# Patient Record
Sex: Male | Born: 1965 | Race: White | Hispanic: No | Marital: Single | Smoking: Never smoker
Health system: Southern US, Community
[De-identification: ages and names within clinical notes are randomized; demographics above are authoritative.]

## PROBLEM LIST (undated history)

## (undated) DIAGNOSIS — M549 Dorsalgia, unspecified: Secondary | ICD-10-CM

## (undated) DIAGNOSIS — K219 Gastro-esophageal reflux disease without esophagitis: Secondary | ICD-10-CM

## (undated) DIAGNOSIS — M722 Plantar fascial fibromatosis: Secondary | ICD-10-CM

## (undated) DIAGNOSIS — H11009 Unspecified pterygium of unspecified eye: Secondary | ICD-10-CM

## (undated) DIAGNOSIS — R3911 Hesitancy of micturition: Secondary | ICD-10-CM

## (undated) HISTORY — DX: Gastro-esophageal reflux disease without esophagitis: K21.9

## (undated) HISTORY — DX: Unspecified pterygium of unspecified eye: H11.009

## (undated) HISTORY — DX: Dorsalgia, unspecified: M54.9

## (undated) HISTORY — DX: Hesitancy of micturition: R39.11

## (undated) HISTORY — DX: Plantar fascial fibromatosis: M72.2

---

## 2012-04-18 ENCOUNTER — Emergency Department: Payer: Self-pay | Admitting: Emergency Medicine

## 2012-04-18 LAB — URINALYSIS, COMPLETE
Bacteria: NONE SEEN
Bilirubin,UR: NEGATIVE
Blood: NEGATIVE
Glucose,UR: NEGATIVE mg/dL (ref 0–75)
Specific Gravity: 1.014 (ref 1.003–1.030)
Squamous Epithelial: NONE SEEN
WBC UR: <1 /HPF (ref 0–5)

## 2014-12-28 ENCOUNTER — Encounter: Payer: Self-pay | Admitting: *Deleted

## 2014-12-28 ENCOUNTER — Emergency Department
Admission: EM | Admit: 2014-12-28 | Discharge: 2014-12-28 | Disposition: A | Discharge status: Home / Self Care | Payer: BLUE CROSS/BLUE SHIELD | Attending: Emergency Medicine | Admitting: Emergency Medicine

## 2014-12-28 ENCOUNTER — Emergency Department: Payer: BLUE CROSS/BLUE SHIELD

## 2014-12-28 DIAGNOSIS — R2241 Localized swelling, mass and lump, right lower limb: Secondary | ICD-10-CM | POA: Diagnosis present

## 2014-12-28 DIAGNOSIS — R609 Edema, unspecified: Secondary | ICD-10-CM | POA: Diagnosis not present

## 2014-12-28 NOTE — ED Notes (Signed)
Patient present to ED with c/o right leg swelling and redness since 4 pm today. Reports he hit his knee on the tailgate of his truck a week ago, reports had his knee wrapped with ace bandage. Redness and swelling noted to lower leg. +pedal pulses and capillary refill WNL. Patient alert and oriented x 4, respirations even and unlabored.

## 2014-12-28 NOTE — ED Provider Notes (Signed)
[facility] Emergency Department Provider Note     Time seen: ----------------------------------------- 8:46 PM on 12/28/2014 -----------------------------------------    I have reviewed the triage vital signs and the nursing notes.   HISTORY  Chief Complaint Leg Swelling    HPI Julian Flores is a 49 y.o. male who presents ER with atraumatic right leg swelling and redness since 4 PM today. Patient states he recently had his right knee but he did not have any swelling. He had the knee wrapped in Ace bandage, does not complain of any pain just noted the swelling today.   No past medical history on file.  There are no active problems to display for this patient.   No past surgical history on file.  Allergies Review of patient's allergies indicates no known allergies.  Social History Social History  Substance Use Topics  . Smoking status: Never Smoker   . Smokeless tobacco: Not on file  . Alcohol Use: Yes    Review of Systems Constitutional: Negative for fever. Eyes: Negative for visual changes. ENT: Negative for sore throat. Cardiovascular: Negative for chest pain. Respiratory: Negative for shortness of breath. Gastrointestinal: Negative for abdominal pain, vomiting and diarrhea. Genitourinary: Negative for dysuria. Musculoskeletal: Positive for right leg swelling Skin: Positive for redness in the right lower extremity Neurological: Negative for headaches, focal weakness or numbness.  10-point ROS otherwise negative.  ____________________________________________   PHYSICAL EXAM:  VITAL SIGNS: ED Triage Vitals  Enc Vitals Group     BP 12/28/14 1933 120/87 mmHg     Pulse Rate 12/28/14 1933 66     Resp 12/28/14 1933 20     Temp 12/28/14 1933 98.4 F (36.9 C)     Temp Source 12/28/14 1933 Oral     SpO2 12/28/14 1933 96 %     Weight 12/28/14 1933 165 lb (74.844 kg)     Height 12/28/14 1933  (1.702 m)     Head Cir --    Peak Flow --      Pain Score 12/28/14 2039 0     Pain Loc --      Pain Edu? --      Excl. in GC? --     Constitutional: Alert and oriented. Well appearing and in no distress. Musculoskeletal: Obvious right lower extremity swelling below the knee Neurologic:  Normal speech and language. No gross focal neurologic deficits are appreciated. Speech is normal. No gait instability. Skin:  Mild skin erythema and near circumferential pattern below the right knee involving the right calf Psychiatric: Mood and affect are normal.   ____________________________________________  ED COURSE:  Pertinent labs & imaging results that were available during my care of the patient were reviewed by me and considered in my medical decision making (see chart for details). Nonspecific unilateral swelling, will obtain an ultrasound ____________________________________________   RADIOLOGY Images were viewed by me  IMPRESSION: No evidence of deep venous thrombosis.  ____________________________________________  FINAL ASSESSMENT AND PLAN  Edema  Plan: Patient with labs and imaging as dictated above. No clear cause for his symptoms other than recent compression around the knee. He is stable for outpatient follow-up with his doctor. This does not appear to be cellulitis at this time.   Emily Filbert, MD   Emily Filbert, MD 12/28/14 [phone]

## 2014-12-28 NOTE — ED Notes (Signed)
Pt walked over from [facility].  Pt has swelling to right lower leg.  Pt has injured right knee twice during the past month.  Noticed swelling this afternoon. No pain.

## 2014-12-28 NOTE — Discharge Instructions (Signed)

## 2015-09-14 DIAGNOSIS — R0789 Other chest pain: Secondary | ICD-10-CM | POA: Insufficient documentation

## 2016-12-01 IMAGING — US US EXTREM LOW VENOUS*R*
1 series · 13 of 24 positions shown · non-contrast
Comparison: None.

CLINICAL DATA: Right leg swelling since 4 p.m.



[Series 1: us extrem low venous*right* · 0.06mm/px · 13 of 37 slices shown]
[im 1/37]
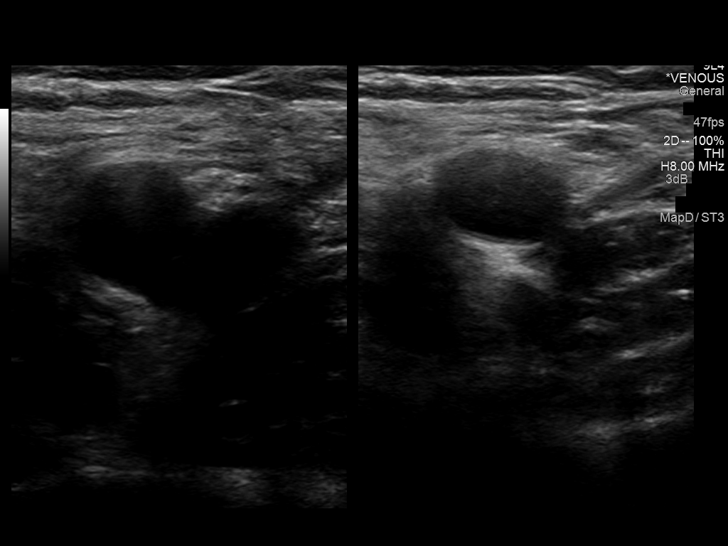
[im 4/37]
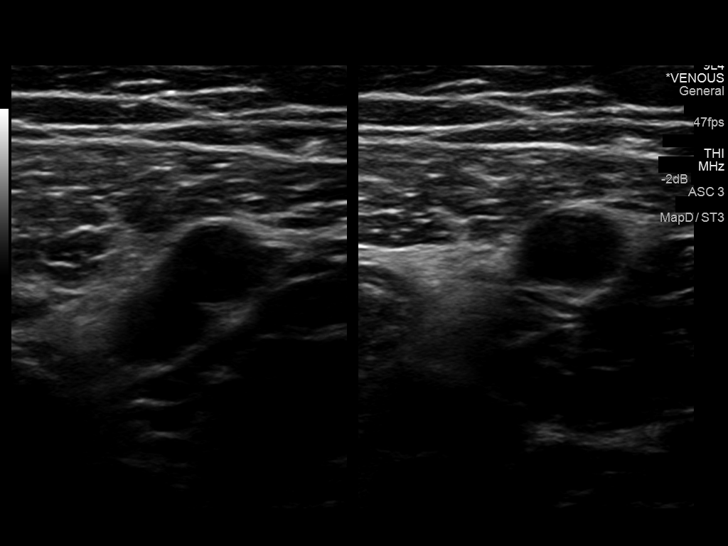
[im 7/37]
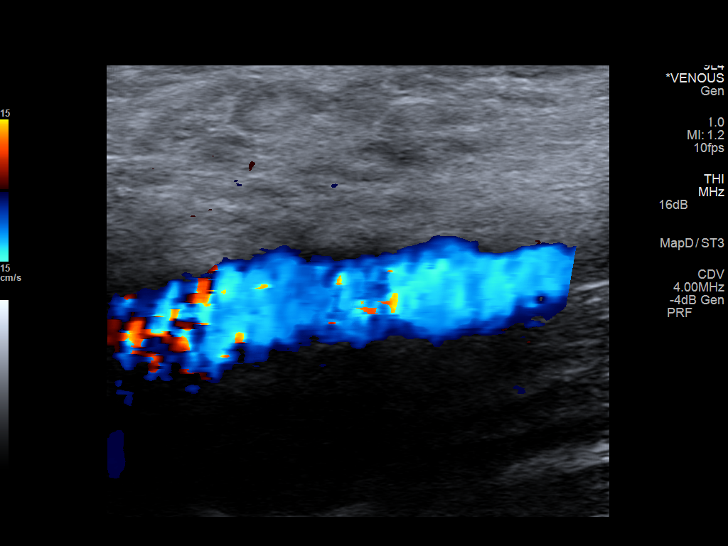
[im 10/37]
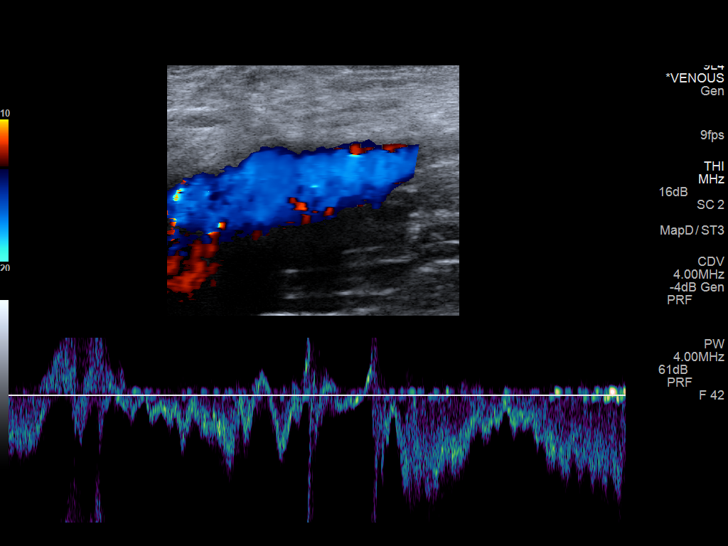
[im 13/37]
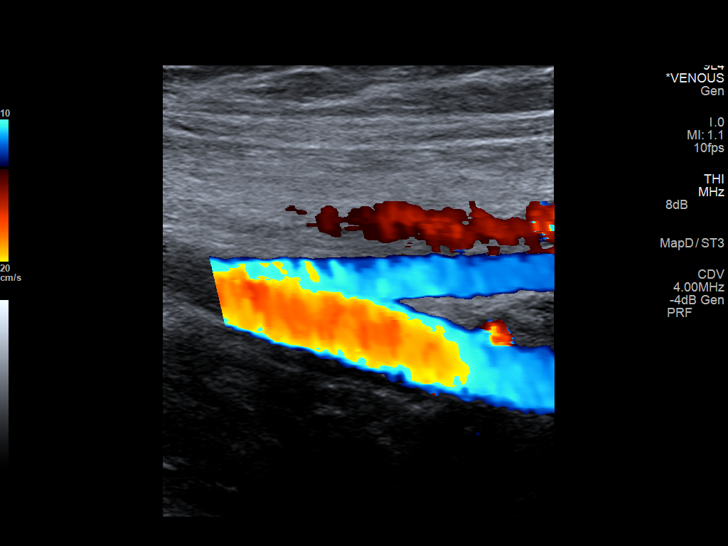
[im 16/37]
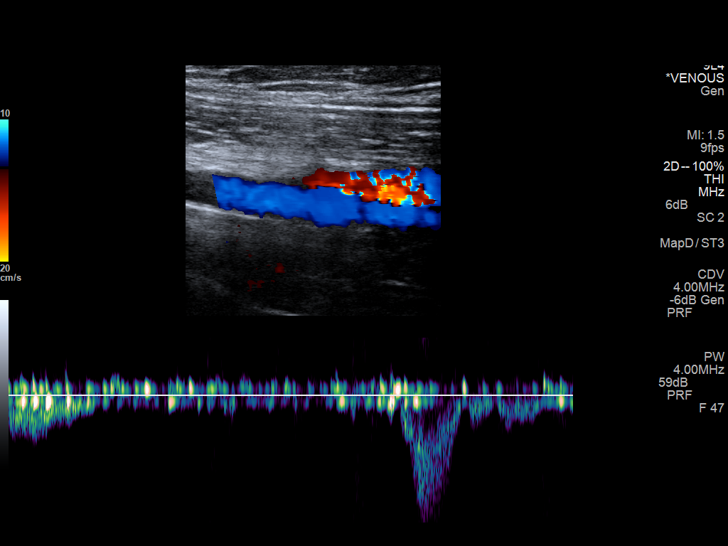
[im 19/37]
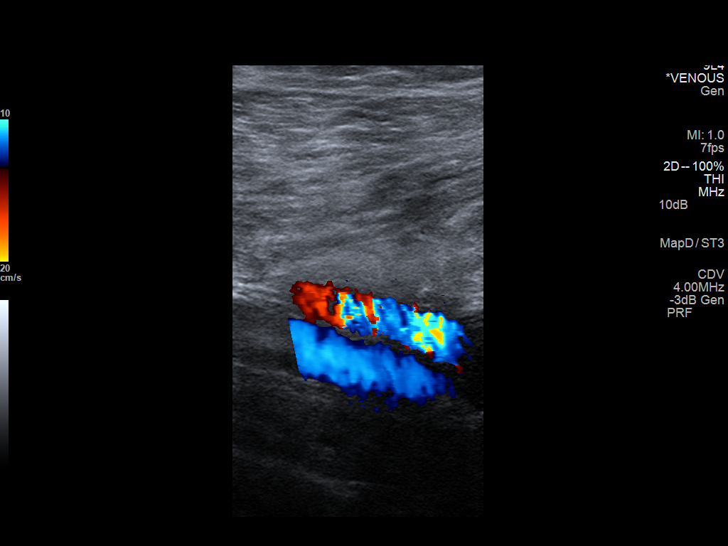
[im 21/37]
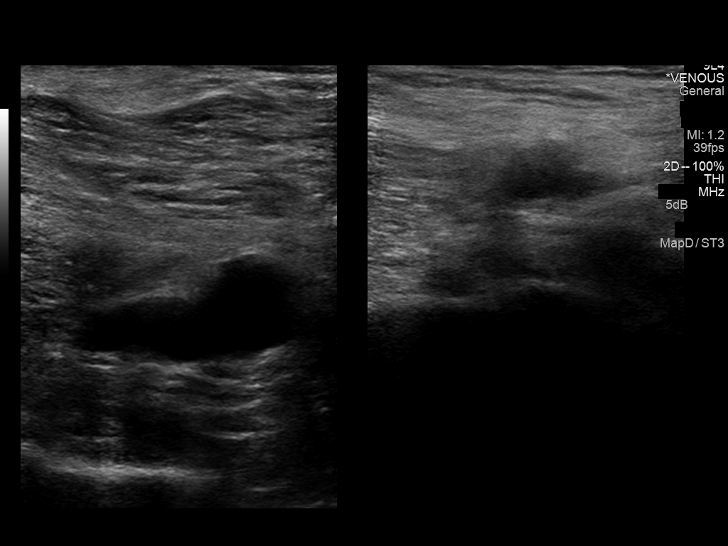
[im 24/37]
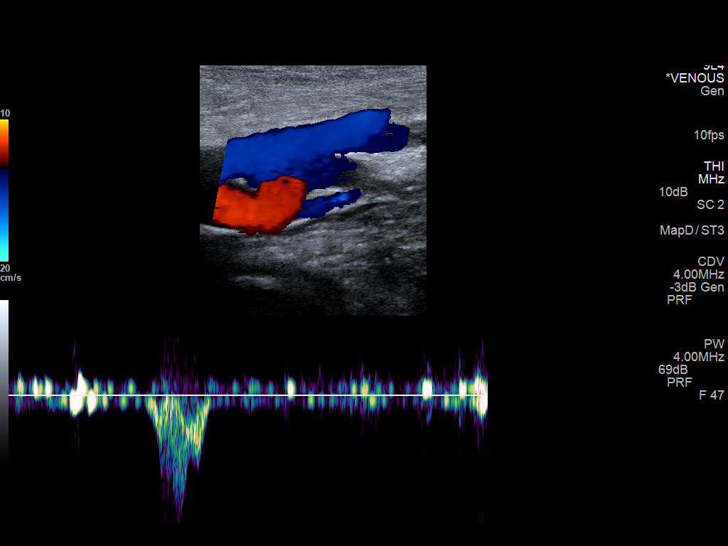
[im 27/37]
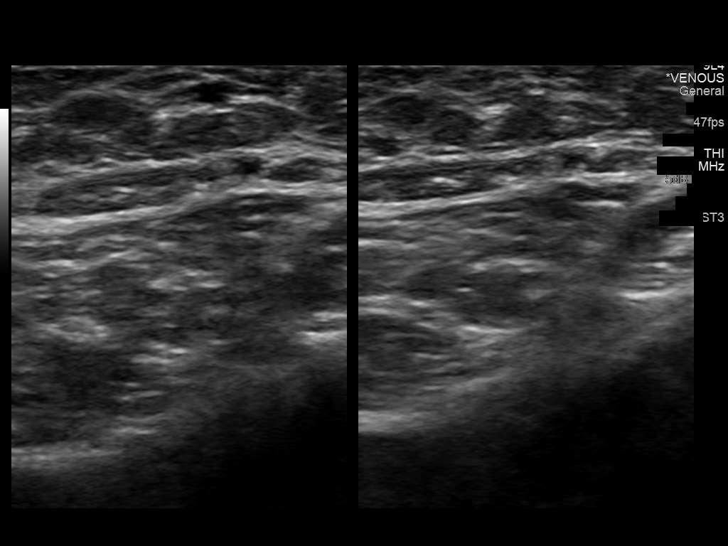
[im 30/37]
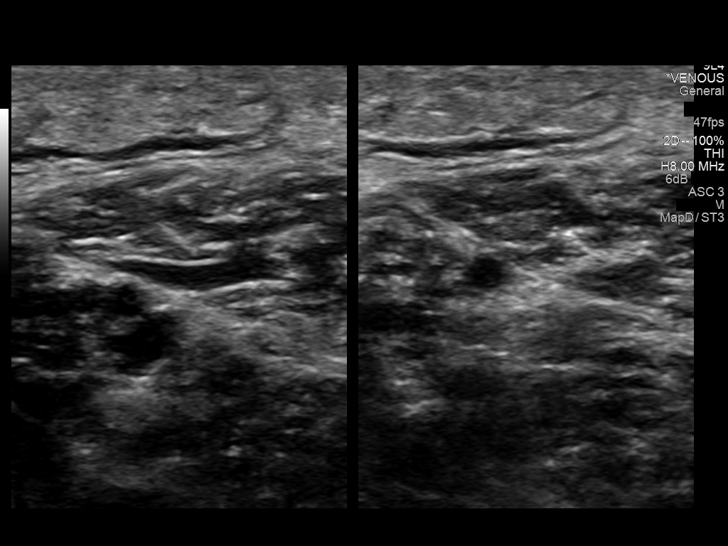
[im 33/37]
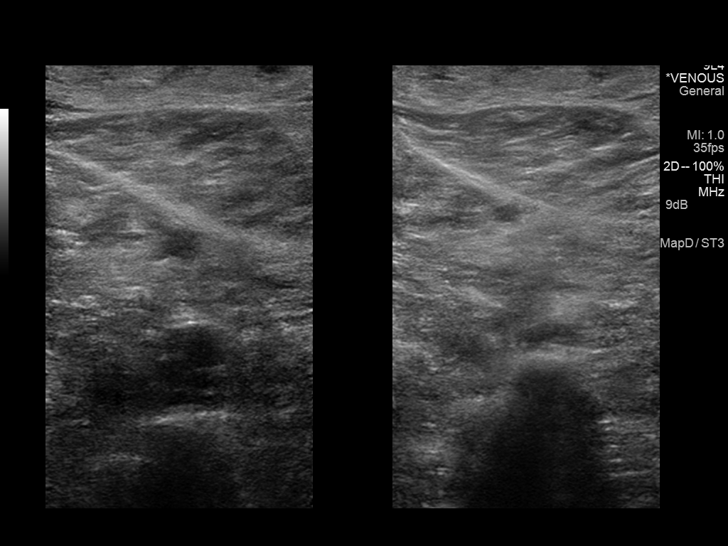
[im 37/37]
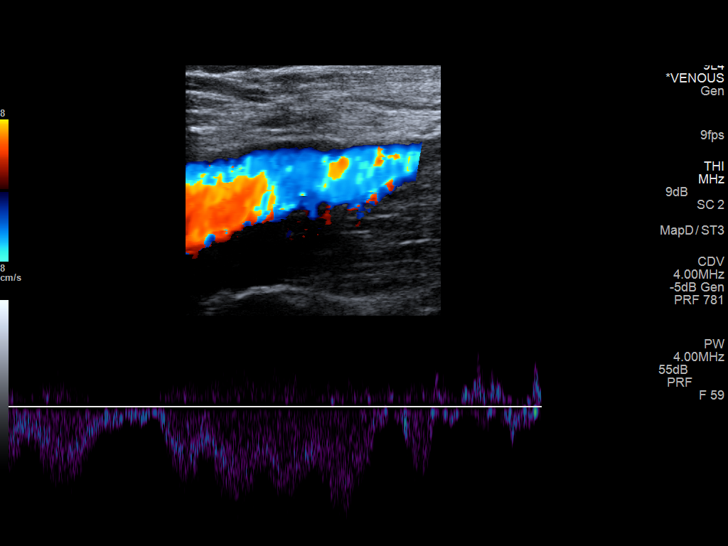

[13 of 24 positions shown; findings below may reference images not displayed]

FINDINGS: Contralateral Common Femoral Vein: Respiratory phasicity is normal
and symmetric with the symptomatic side. No evidence of thrombus.
Normal compressibility.

Common Femoral Vein: No evidence of thrombus. Normal
compressibility, respiratory phasicity and response to augmentation.

Saphenofemoral Junction: No evidence of thrombus. Normal
compressibility and flow on color Doppler imaging.

Profunda Femoral Vein: No evidence of thrombus. Normal
compressibility and flow on color Doppler imaging.

Femoral Vein: No evidence of thrombus. Normal compressibility,
respiratory phasicity and response to augmentation.

Popliteal Vein: No evidence of thrombus. Normal compressibility,
respiratory phasicity and response to augmentation.

Calf Veins: No evidence of thrombus. Normal compressibility and flow
on color Doppler imaging.

Superficial Great Saphenous Vein: No evidence of thrombus. Normal
compressibility and flow on color Doppler imaging.

Venous Reflux:  None.

Other Findings:  None.
IMPRESSION: No evidence of deep venous thrombosis.

## 2018-10-29 DIAGNOSIS — M5106 Intervertebral disc disorders with myelopathy, lumbar region: Secondary | ICD-10-CM | POA: Diagnosis not present

## 2018-10-29 DIAGNOSIS — M5431 Sciatica, right side: Secondary | ICD-10-CM | POA: Diagnosis not present

## 2018-10-29 DIAGNOSIS — M5136 Other intervertebral disc degeneration, lumbar region: Secondary | ICD-10-CM | POA: Diagnosis not present

## 2018-11-05 DIAGNOSIS — M545 Low back pain: Secondary | ICD-10-CM | POA: Diagnosis not present

## 2018-11-05 DIAGNOSIS — M5116 Intervertebral disc disorders with radiculopathy, lumbar region: Secondary | ICD-10-CM | POA: Diagnosis not present

## 2018-11-05 DIAGNOSIS — R102 Pelvic and perineal pain: Secondary | ICD-10-CM | POA: Diagnosis not present

## 2018-11-05 DIAGNOSIS — M25551 Pain in right hip: Secondary | ICD-10-CM | POA: Diagnosis not present

## 2018-11-05 DIAGNOSIS — M4726 Other spondylosis with radiculopathy, lumbar region: Secondary | ICD-10-CM | POA: Diagnosis not present

## 2018-11-05 DIAGNOSIS — M5117 Intervertebral disc disorders with radiculopathy, lumbosacral region: Secondary | ICD-10-CM | POA: Diagnosis not present

## 2018-11-16 DIAGNOSIS — M5106 Intervertebral disc disorders with myelopathy, lumbar region: Secondary | ICD-10-CM | POA: Diagnosis not present

## 2018-11-16 DIAGNOSIS — M48062 Spinal stenosis, lumbar region with neurogenic claudication: Secondary | ICD-10-CM | POA: Diagnosis not present

## 2018-11-16 DIAGNOSIS — M5136 Other intervertebral disc degeneration, lumbar region: Secondary | ICD-10-CM | POA: Diagnosis not present

## 2018-11-16 DIAGNOSIS — M5431 Sciatica, right side: Secondary | ICD-10-CM | POA: Diagnosis not present

## 2019-01-19 DIAGNOSIS — M5416 Radiculopathy, lumbar region: Secondary | ICD-10-CM | POA: Diagnosis not present

## 2019-01-19 DIAGNOSIS — M5126 Other intervertebral disc displacement, lumbar region: Secondary | ICD-10-CM | POA: Diagnosis not present

## 2019-01-20 ENCOUNTER — Ambulatory Visit: Payer: Self-pay

## 2019-01-20 DIAGNOSIS — Z23 Encounter for immunization: Secondary | ICD-10-CM

## 2019-02-04 ENCOUNTER — Other Ambulatory Visit: Payer: Self-pay

## 2019-02-04 ENCOUNTER — Ambulatory Visit: Payer: Self-pay

## 2019-02-04 DIAGNOSIS — Z0000 Encounter for general adult medical examination without abnormal findings: Secondary | ICD-10-CM

## 2019-02-05 LAB — CMP12+LP+TP+TSH+6AC+PSA+CBC…
ALT: 6 IU/L (ref 0–44)
AST: 21 IU/L (ref 0–40)
Albumin/Globulin Ratio: 1.9 (ref 1.2–2.2)
Alkaline Phosphatase: 43 IU/L (ref 39–117)
BUN/Creatinine Ratio: 17 (ref 9–20)
BUN: 19 mg/dL (ref 6–24)
Bilirubin Total: 0.5 mg/dL (ref 0.0–1.2)
Chol/HDL Ratio: 3.4 ratio (ref 0.0–5.0)
Cholesterol, Total: 169 mg/dL (ref 100–199)
Creatinine, Ser: 1.13 mg/dL (ref 0.76–1.27)
Estimated CHD Risk: 0.5 times avg. (ref 0.0–1.0)
Free Thyroxine Index: 1.5 (ref 1.2–4.9)
GGT: 12 IU/L (ref 0–65)
HDL: 49 mg/dL (ref >39)
Hematocrit: 46.1 % (ref 37.5–51.0)
Hemoglobin: 15.3 g/dL (ref 13.0–17.7)
Immature Grans (Abs): 0 10*3/uL (ref 0.0–0.1)
Immature Granulocytes: 0 %
Iron: 100 ug/dL (ref 38–169)
LDH: 195 IU/L (ref 121–224)
LDL Chol Calc (NIH): 110 mg/dL — ABNORMAL HIGH (ref 0–99)
Lymphocytes Absolute: 1.1 10*3/uL (ref 0.7–3.1)
Lymphs: 30 %
MCH: 29.5 pg (ref 26.6–33.0)
MCV: 89 fL (ref 79–97)
Monocytes Absolute: 0.4 10*3/uL (ref 0.1–0.9)
Monocytes: 11 %
Neutrophils Absolute: 2 10*3/uL (ref 1.4–7.0)
Phosphorus: 3.7 mg/dL (ref 2.8–4.1)
Platelets: 180 10*3/uL (ref 150–450)
RBC: 5.18 x10E6/uL (ref 4.14–5.80)
Sodium: 140 mmol/L (ref 134–144)
T3 Uptake Ratio: 28 % (ref 24–39)
T4, Total: 5.5 ug/dL (ref 4.5–12.0)
TSH: 0.848 u[IU]/mL (ref 0.450–4.500)
Total Protein: 6.9 g/dL (ref 6.0–8.5)
Triglycerides: 52 mg/dL (ref 0–149)
Uric Acid: 4.8 mg/dL (ref 3.7–8.6)
VLDL Cholesterol Cal: 10 mg/dL (ref 5–40)

## 2019-02-05 LAB — CMP12+LP+TP+TSH+6AC+PSA+CBC?
Albumin: 4.5 g/dL (ref 3.8–4.9)
Basophils Absolute: 0 10*3/uL (ref 0.0–0.2)
Basos: 1 %
Calcium: 9.4 mg/dL (ref 8.7–10.2)
Chloride: 106 mmol/L (ref 96–106)
EOS (ABSOLUTE): 0.1 10*3/uL (ref 0.0–0.4)
Eos: 2 %
GFR calc Af Amer: 87 mL/min/{1.73_m2} (ref >59)
GFR calc non Af Amer: 75 mL/min/{1.73_m2} (ref >59)
Globulin, Total: 2.4 g/dL (ref 1.5–4.5)
Glucose: 91 mg/dL (ref 65–99)
MCHC: 33.2 g/dL (ref 31.5–35.7)
Neutrophils: 56 %
Potassium: 4.3 mmol/L (ref 3.5–5.2)
Prostate Specific Ag, Serum: 1 ng/mL (ref 0.0–4.0)
RDW: 12.4 % (ref 11.6–15.4)
WBC: 3.6 10*3/uL (ref 3.4–10.8)

## 2019-02-05 LAB — POCT URINALYSIS DIPSTICK
Bilirubin, UA: NEGATIVE
Blood, UA: NEGATIVE
Glucose, UA: NEGATIVE
Ketones, UA: NEGATIVE
Leukocytes, UA: NEGATIVE
Nitrite, UA: NEGATIVE
Protein, UA: NEGATIVE
Spec Grav, UA: >=1.03 — AB (ref 1.010–1.025)
Urobilinogen, UA: 0.2 E.U./dL
pH, UA: 6 (ref 5.0–8.0)

## 2019-03-15 ENCOUNTER — Other Ambulatory Visit: Payer: Self-pay

## 2019-03-15 ENCOUNTER — Encounter: Payer: Self-pay | Admitting: Occupational Medicine

## 2019-03-15 ENCOUNTER — Ambulatory Visit: Payer: 59 | Admitting: Occupational Medicine

## 2019-03-15 VITALS — BP 120/80 | HR 72 | Temp 98.7°F | Resp 16 | Ht 70.0 in | Wt 162.0 lb

## 2019-03-15 DIAGNOSIS — Z0000 Encounter for general adult medical examination without abnormal findings: Secondary | ICD-10-CM

## 2019-03-29 DIAGNOSIS — Z03818 Encounter for observation for suspected exposure to other biological agents ruled out: Secondary | ICD-10-CM | POA: Diagnosis not present

## 2019-03-29 DIAGNOSIS — J22 Unspecified acute lower respiratory infection: Secondary | ICD-10-CM | POA: Diagnosis not present

## 2019-03-29 DIAGNOSIS — R05 Cough: Secondary | ICD-10-CM | POA: Diagnosis not present

## 2019-04-04 DIAGNOSIS — Z20828 Contact with and (suspected) exposure to other viral communicable diseases: Secondary | ICD-10-CM | POA: Diagnosis not present

## 2019-09-27 DIAGNOSIS — M545 Low back pain: Secondary | ICD-10-CM | POA: Diagnosis not present

## 2019-12-01 ENCOUNTER — Other Ambulatory Visit: Payer: Self-pay

## 2019-12-01 ENCOUNTER — Other Ambulatory Visit: Payer: 59

## 2019-12-01 NOTE — Progress Notes (Signed)
[facility] Specimen ID: [id number]

## 2020-01-10 ENCOUNTER — Other Ambulatory Visit: Payer: 59

## 2020-01-10 ENCOUNTER — Other Ambulatory Visit: Payer: Self-pay

## 2020-01-10 DIAGNOSIS — Z1152 Encounter for screening for COVID-19: Secondary | ICD-10-CM

## 2020-01-10 NOTE — Progress Notes (Signed)
Pt presents today with covid exposer with no symptoms and has had both vaccines. CL,RMA

## 2020-01-12 LAB — NOVEL CORONAVIRUS, NAA: SARS-CoV-2, NAA: NOT DETECTED

## 2020-01-12 LAB — SARS-COV-2, NAA 2 DAY TAT

## 2020-01-15 DIAGNOSIS — Z03818 Encounter for observation for suspected exposure to other biological agents ruled out: Secondary | ICD-10-CM | POA: Diagnosis not present

## 2020-01-15 DIAGNOSIS — B9689 Other specified bacterial agents as the cause of diseases classified elsewhere: Secondary | ICD-10-CM | POA: Diagnosis not present

## 2020-01-15 DIAGNOSIS — J209 Acute bronchitis, unspecified: Secondary | ICD-10-CM | POA: Diagnosis not present

## 2020-01-15 DIAGNOSIS — J019 Acute sinusitis, unspecified: Secondary | ICD-10-CM | POA: Diagnosis not present

## 2020-02-03 NOTE — Progress Notes (Signed)
Scheduled to complete physical 02/14/20 with Durward Parcel, PA-C.  AMD

## 2020-02-04 ENCOUNTER — Ambulatory Visit: Payer: Self-pay

## 2020-02-04 ENCOUNTER — Other Ambulatory Visit: Payer: Self-pay

## 2020-02-04 DIAGNOSIS — Z0000 Encounter for general adult medical examination without abnormal findings: Secondary | ICD-10-CM

## 2020-02-04 LAB — POCT URINALYSIS DIPSTICK
Bilirubin, UA: NEGATIVE
Blood, UA: NEGATIVE
Glucose, UA: NEGATIVE
Ketones, UA: NEGATIVE
Leukocytes, UA: NEGATIVE
Nitrite, UA: NEGATIVE
Protein, UA: NEGATIVE
Spec Grav, UA: 1.025 (ref 1.010–1.025)
Urobilinogen, UA: 0.2 E.U./dL
pH, UA: 6 (ref 5.0–8.0)

## 2020-02-07 LAB — CMP12+LP+TP+TSH+6AC+PSA+CBC…
ALT: 6 IU/L (ref 0–44)
AST: 15 IU/L (ref 0–40)
Albumin/Globulin Ratio: 2.2 (ref 1.2–2.2)
Albumin: 4.7 g/dL (ref 3.8–4.9)
Alkaline Phosphatase: 40 IU/L — ABNORMAL LOW (ref 44–121)
BUN/Creatinine Ratio: 18 (ref 9–20)
BUN: 19 mg/dL (ref 6–24)
Basophils Absolute: 0 10*3/uL (ref 0.0–0.2)
Basos: 1 %
Bilirubin Total: 0.6 mg/dL (ref 0.0–1.2)
Calcium: 9.4 mg/dL (ref 8.7–10.2)
Chloride: 104 mmol/L (ref 96–106)
Chol/HDL Ratio: 3.9 ratio (ref 0.0–5.0)
Cholesterol, Total: 184 mg/dL (ref 100–199)
Creatinine, Ser: 1.05 mg/dL (ref 0.76–1.27)
EOS (ABSOLUTE): 0.1 10*3/uL (ref 0.0–0.4)
Eos: 2 %
Estimated CHD Risk: 0.7 times avg. (ref 0.0–1.0)
Free Thyroxine Index: 1.4 (ref 1.2–4.9)
GFR calc Af Amer: 94 mL/min/{1.73_m2} (ref >59)
GFR calc non Af Amer: 81 mL/min/{1.73_m2} (ref >59)
GGT: 11 IU/L (ref 0–65)
Globulin, Total: 2.1 g/dL (ref 1.5–4.5)
Glucose: 92 mg/dL (ref 65–99)
HDL: 47 mg/dL (ref >39)
Hematocrit: 44.3 % (ref 37.5–51.0)
Hemoglobin: 15.3 g/dL (ref 13.0–17.7)
Immature Grans (Abs): 0 10*3/uL (ref 0.0–0.1)
Immature Granulocytes: 0 %
Iron: 91 ug/dL (ref 38–169)
LDH: 193 IU/L (ref 121–224)
LDL Chol Calc (NIH): 127 mg/dL — ABNORMAL HIGH (ref 0–99)
Lymphocytes Absolute: 1.4 10*3/uL (ref 0.7–3.1)
Lymphs: 32 %
MCH: 30.4 pg (ref 26.6–33.0)
MCHC: 34.5 g/dL (ref 31.5–35.7)
MCV: 88 fL (ref 79–97)
Monocytes Absolute: 0.5 10*3/uL (ref 0.1–0.9)
Monocytes: 12 %
Neutrophils Absolute: 2.2 10*3/uL (ref 1.4–7.0)
Neutrophils: 53 %
Phosphorus: 3.4 mg/dL (ref 2.8–4.1)
Platelets: 186 10*3/uL (ref 150–450)
Potassium: 4.4 mmol/L (ref 3.5–5.2)
Prostate Specific Ag, Serum: 1.5 ng/mL (ref 0.0–4.0)
RBC: 5.04 x10E6/uL (ref 4.14–5.80)
RDW: 12.3 % (ref 11.6–15.4)
Sodium: 141 mmol/L (ref 134–144)
T3 Uptake Ratio: 26 % (ref 24–39)
T4, Total: 5.2 ug/dL (ref 4.5–12.0)
TSH: 0.8 u[IU]/mL (ref 0.450–4.500)
Total Protein: 6.8 g/dL (ref 6.0–8.5)
Triglycerides: 50 mg/dL (ref 0–149)
Uric Acid: 5.1 mg/dL (ref 3.8–8.4)
VLDL Cholesterol Cal: 10 mg/dL (ref 5–40)
WBC: 4.2 10*3/uL (ref 3.4–10.8)

## 2020-02-07 LAB — QUANTIFERON-TB GOLD PLUS
QuantiFERON Mitogen Value: >10 IU/mL
QuantiFERON Nil Value: 0.03 IU/mL
QuantiFERON TB1 Ag Value: 0.01 IU/mL
QuantiFERON TB2 Ag Value: 0.02 IU/mL
QuantiFERON-TB Gold Plus: NEGATIVE

## 2020-02-08 ENCOUNTER — Other Ambulatory Visit: Payer: Self-pay

## 2020-02-08 ENCOUNTER — Ambulatory Visit: Payer: Self-pay

## 2020-02-08 DIAGNOSIS — Z0283 Encounter for blood-alcohol and blood-drug test: Secondary | ICD-10-CM

## 2020-02-08 NOTE — Progress Notes (Signed)
Presents for Random Drug Screen & Alcohol Screen. [facility] Account # [id number] [facility] Specimen # [id number]  AMD

## 2020-02-14 ENCOUNTER — Encounter: Payer: 59 | Admitting: Physician Assistant

## 2020-02-14 DIAGNOSIS — Z0000 Encounter for general adult medical examination without abnormal findings: Secondary | ICD-10-CM

## 2020-02-23 ENCOUNTER — Ambulatory Visit: Payer: 59

## 2020-03-08 ENCOUNTER — Ambulatory Visit: Payer: Self-pay

## 2020-03-08 DIAGNOSIS — Z23 Encounter for immunization: Secondary | ICD-10-CM

## 2020-04-07 ENCOUNTER — Encounter: Payer: 59 | Admitting: Physician Assistant

## 2020-04-07 DIAGNOSIS — R509 Fever, unspecified: Secondary | ICD-10-CM | POA: Diagnosis not present

## 2020-04-07 DIAGNOSIS — R0989 Other specified symptoms and signs involving the circulatory and respiratory systems: Secondary | ICD-10-CM | POA: Diagnosis not present

## 2020-04-07 DIAGNOSIS — Z03818 Encounter for observation for suspected exposure to other biological agents ruled out: Secondary | ICD-10-CM | POA: Diagnosis not present

## 2020-04-07 DIAGNOSIS — R6889 Other general symptoms and signs: Secondary | ICD-10-CM | POA: Diagnosis not present

## 2020-04-07 DIAGNOSIS — R059 Cough, unspecified: Secondary | ICD-10-CM | POA: Diagnosis not present

## 2020-04-07 DIAGNOSIS — R000 Tachycardia, unspecified: Secondary | ICD-10-CM | POA: Diagnosis not present

## 2020-05-03 DIAGNOSIS — M5416 Radiculopathy, lumbar region: Secondary | ICD-10-CM | POA: Diagnosis not present

## 2020-05-03 DIAGNOSIS — M5126 Other intervertebral disc displacement, lumbar region: Secondary | ICD-10-CM | POA: Diagnosis not present

## 2020-05-09 DIAGNOSIS — U071 COVID-19: Secondary | ICD-10-CM | POA: Diagnosis not present

## 2020-05-09 DIAGNOSIS — R059 Cough, unspecified: Secondary | ICD-10-CM | POA: Diagnosis not present

## 2020-05-09 DIAGNOSIS — R058 Other specified cough: Secondary | ICD-10-CM | POA: Diagnosis not present

## 2020-05-09 DIAGNOSIS — R6889 Other general symptoms and signs: Secondary | ICD-10-CM | POA: Diagnosis not present

## 2020-05-25 ENCOUNTER — Ambulatory Visit: Payer: Self-pay | Admitting: Adult Medicine

## 2020-05-25 ENCOUNTER — Encounter: Payer: Self-pay | Admitting: Adult Medicine

## 2020-05-25 ENCOUNTER — Other Ambulatory Visit: Payer: Self-pay

## 2020-05-25 VITALS — BP 132/91 | HR 72 | Temp 98.3°F | Resp 14 | Ht 66.0 in | Wt 162.0 lb

## 2020-05-25 DIAGNOSIS — Z0000 Encounter for general adult medical examination without abnormal findings: Secondary | ICD-10-CM

## 2020-05-25 MED ORDER — VITAMIN D (ERGOCALCIFEROL) 1.25 MG (50000 UNIT) PO CAPS: 50000.0000 [IU] | ORAL_CAPSULE | ORAL | 1 refills | Status: AC

## 2020-05-25 MED ORDER — VIT C-QUERCET-BIOFLV-BROMELAIN 450-250-125-50 MG PO CAPS: 2.0000 | ORAL_CAPSULE | Freq: Three times a day (TID) | ORAL | 0 refills | Status: DC

## 2020-05-25 NOTE — Progress Notes (Signed)
I have reviewed the triage vital signs and the nursing notes.  HISTORY  Chief Complaint:  HPI Julian Flores is a 55 y.o. male     ADDITIONAL HISTORY: Chronic low back pain with right leg pain and radiculopathy increasing.    Rx by pmd for Influenza, cough fever 100.5 Covid + 05/13/20 prednisone (DELTASONE) 20 MG tablet  Take 2 tablets (40 mg total) by mouth once daily for 10 days 20 tablet  05/09/2020 05/19/2020  doxycycline (VIBRAMYCIN) 100 MG capsule Take 1 capsule (100 mg total) by mouth 2 (two) times daily for 7 days 14 capsule  05/09/2020 05/16/2020    Past Medical History:  Diagnosis Date  . Back pain   . GERD (gastroesophageal reflux disease)   . Plantar fasciitis    Right  . Pterygium   . Urinary hesitancy    Patient Active Problem List   Diagnosis Date Noted  . Chest pain, atypical 09/14/2015   No past surgical history on file.  Prior to Admission medications   Medication Sig Start Date End Date Taking? Authorizing Provider  cyclobenzaprine (FLEXERIL) 10 MG tablet Take by mouth. 05/21/17   [provider]  HYDROcodone-acetaminophen (NORCO) 7.5-325 MG tablet 1/2-1 po bid prn 10/15/18   [provider]  meloxicam (MOBIC) 7.5 MG tablet Take by mouth. 05/21/17   [provider]  Multiple Vitamin (MULTIVITAMIN) capsule Take by mouth.    [provider]    Allergies Patient has no known allergies. Family History  Problem Relation Age of Onset  . Cancer Mother   . Cancer Father    Social History Social History   Tobacco Use  . Smoking status: Never Smoker  . Smokeless tobacco: Never Used  Substance Use Topics  . Alcohol use: Yes   Review of Systems Constitutional: No fever/chills Eyes: No visual changes. ENT: No sore throat. Cardiovascular: Denies chest pain. Respiratory: Denies shortness of breath. Gastrointestinal: No abdominal pain.  No nausea, no vomiting.  No diarrhea.  No constipation. Genitourinary: Negative for  dysuria. Musculoskeletal: Negative for back pain. Skin: Negative for rash. Neurological: Negative for headaches, focal weakness or numbness. Psychiatric: stable mood   ____________________________________________   PHYSICAL EXAM: VITAL SIGNS: nl bmi, 132/88 Constitutional: Alert and oriented. Well appearing and in no acute distress. Eyes: Conjunctivae are normal. PERRL. EOMI. Head: Atraumatic. Nose: No congestion/rhinnorhea. Mouth/Throat: Mucous membranes are moist.  Oropharynx non-erythematous. Neck: No stridor.   No cervical lymphadenopathy. Cardiovascular: Normal rate, regular rhythm. Grossly normal heart sounds.  Good peripheral circulation. Respiratory: Normal respiratory effort.  No retractions. Lungs CTAB. Gastrointestinal: Soft and nontender. No distention. No abdominal bruits. No CVA tenderness. *Genitourinary: 2 nl size scrotal testes nl masses Extremity nl gait, neg SLR sign, axial spine full flexion side bending nontender Musculoskeletal: No lower extremity tenderness nor edema.  No joint effusions. Neurologic:  Normal speech and language. No gross focal neurologic deficits are appreciated. No gait instability. Skin:  Skin is warm, dry and intact. No rash noted. Psychiatric: Mood and affect are normal. Speech and behavior are normal. ____________________________________________   Procedure  Mri 1. Large broad-based disc protrusion at L5-S1 with right lateral neural foraminal component causing severe right lateral recess and right neural foraminal stenosis. Severe impingement of the exiting right L5 nerve root within the neural foramen. There is moderate left neural foraminal stenosis without spinal canal stenosis. 2. Disc height loss and disc bulge at L4-L5 with annulus fissure. No canal or foraminal stenosis. 3. Mild disc bulge at L3-L4 with  small right lateral annulus fissure. No canal or foraminal stenosis  Procedures: 05/03/2020: Right L5-S1 transforaminal  ESI 01/19/2019: Left L5-S1 transforaminal ESI (good relief) 10/08/2018: Right L5-S1 transforaminal ESI (good relief, took 7 to 10 days to take effect) 04/11/2017: Right L5-S1 transforaminal ESI (good relief) 06/26/2016: Right L5-S1 transforaminal ESI  ____________________________________________   INITIAL IMPRESSION / ASSESSMENT AND PLAN  Well Visit only mention with multiseg lumbar spine findings, incr vitd vitc State he has gotten continued pain relief from procedures noted above  lumbar radiculitis  At this exam is assym. continue with treating surgeon

## 2020-06-29 DIAGNOSIS — K219 Gastro-esophageal reflux disease without esophagitis: Secondary | ICD-10-CM | POA: Insufficient documentation

## 2020-06-30 DIAGNOSIS — M519 Unspecified thoracic, thoracolumbar and lumbosacral intervertebral disc disorder: Secondary | ICD-10-CM | POA: Insufficient documentation

## 2020-06-30 DIAGNOSIS — Z0000 Encounter for general adult medical examination without abnormal findings: Secondary | ICD-10-CM | POA: Diagnosis not present

## 2020-06-30 DIAGNOSIS — E7800 Pure hypercholesterolemia, unspecified: Secondary | ICD-10-CM | POA: Insufficient documentation

## 2020-11-13 ENCOUNTER — Other Ambulatory Visit: Payer: Self-pay

## 2020-11-13 NOTE — Progress Notes (Signed)
Presents to [facility] for random drug screen & breath alcohol screen.  [facility] Acct #:  [id number] [facility] Specimen #:  [id number]  AMD

## 2021-01-31 DIAGNOSIS — M5126 Other intervertebral disc displacement, lumbar region: Secondary | ICD-10-CM | POA: Diagnosis not present

## 2021-01-31 DIAGNOSIS — M5416 Radiculopathy, lumbar region: Secondary | ICD-10-CM | POA: Diagnosis not present

## 2021-02-07 ENCOUNTER — Other Ambulatory Visit: Payer: Self-pay

## 2021-02-07 ENCOUNTER — Ambulatory Visit: Payer: Self-pay

## 2021-02-07 DIAGNOSIS — Z0000 Encounter for general adult medical examination without abnormal findings: Secondary | ICD-10-CM

## 2021-02-07 DIAGNOSIS — Z23 Encounter for immunization: Secondary | ICD-10-CM

## 2021-02-07 LAB — POCT URINALYSIS DIPSTICK
Bilirubin, UA: NEGATIVE
Blood, UA: NEGATIVE
Glucose, UA: NEGATIVE
Ketones, UA: NEGATIVE
Leukocytes, UA: NEGATIVE
Nitrite, UA: NEGATIVE
Protein, UA: NEGATIVE
Spec Grav, UA: 1.025 (ref 1.010–1.025)
Urobilinogen, UA: 0.2 E.U./dL
pH, UA: 6 (ref 5.0–8.0)

## 2021-02-07 NOTE — Progress Notes (Signed)
02/15/21 physical scheduled.  Reviewed CDC recommendations for importance of HIV/ Hep C screening once in lifetime. Patient has declined HIV / Hep C screenings today and will let us know if they should change their mind in the future.

## 2021-02-08 LAB — CMP12+LP+TP+TSH+6AC+PSA+CBC…
ALT: 6 IU/L (ref 0–44)
AST: 16 IU/L (ref 0–40)
Albumin/Globulin Ratio: 2.8 — ABNORMAL HIGH (ref 1.2–2.2)
Albumin: 4.7 g/dL (ref 3.8–4.9)
Alkaline Phosphatase: 42 IU/L — ABNORMAL LOW (ref 44–121)
BUN/Creatinine Ratio: 12 (ref 9–20)
BUN: 13 mg/dL (ref 6–24)
Basophils Absolute: 0 10*3/uL (ref 0.0–0.2)
Basos: 0 %
Bilirubin Total: 0.5 mg/dL (ref 0.0–1.2)
Calcium: 9.4 mg/dL (ref 8.7–10.2)
Chloride: 105 mmol/L (ref 96–106)
Chol/HDL Ratio: 3.8 ratio (ref 0.0–5.0)
Cholesterol, Total: 187 mg/dL (ref 100–199)
Creatinine, Ser: 1.08 mg/dL (ref 0.76–1.27)
EOS (ABSOLUTE): 0.1 10*3/uL (ref 0.0–0.4)
Eos: 2 %
Estimated CHD Risk: 0.7 times avg. (ref 0.0–1.0)
Free Thyroxine Index: 1.6 (ref 1.2–4.9)
GGT: 10 IU/L (ref 0–65)
Globulin, Total: 1.7 g/dL (ref 1.5–4.5)
Glucose: 88 mg/dL (ref 70–99)
HDL: 49 mg/dL (ref >39)
Hematocrit: 48.1 % (ref 37.5–51.0)
Hemoglobin: 15.7 g/dL (ref 13.0–17.7)
Immature Grans (Abs): 0 10*3/uL (ref 0.0–0.1)
Immature Granulocytes: 0 %
Iron: 78 ug/dL (ref 38–169)
LDH: 197 IU/L (ref 121–224)
LDL Chol Calc (NIH): 125 mg/dL — ABNORMAL HIGH (ref 0–99)
Lymphocytes Absolute: 1.1 10*3/uL (ref 0.7–3.1)
Lymphs: 20 %
MCH: 29 pg (ref 26.6–33.0)
MCHC: 32.6 g/dL (ref 31.5–35.7)
MCV: 89 fL (ref 79–97)
Monocytes Absolute: 0.5 10*3/uL (ref 0.1–0.9)
Monocytes: 10 %
Neutrophils Absolute: 3.5 10*3/uL (ref 1.4–7.0)
Neutrophils: 68 %
Phosphorus: 3.2 mg/dL (ref 2.8–4.1)
Platelets: 190 10*3/uL (ref 150–450)
Potassium: 4.5 mmol/L (ref 3.5–5.2)
Prostate Specific Ag, Serum: 1.5 ng/mL (ref 0.0–4.0)
RBC: 5.41 x10E6/uL (ref 4.14–5.80)
RDW: 12.6 % (ref 11.6–15.4)
Sodium: 143 mmol/L (ref 134–144)
T3 Uptake Ratio: 28 % (ref 24–39)
T4, Total: 5.7 ug/dL (ref 4.5–12.0)
TSH: 1.25 u[IU]/mL (ref 0.450–4.500)
Total Protein: 6.4 g/dL (ref 6.0–8.5)
Triglycerides: 72 mg/dL (ref 0–149)
Uric Acid: 4.3 mg/dL (ref 3.8–8.4)
VLDL Cholesterol Cal: 13 mg/dL (ref 5–40)
WBC: 5.3 10*3/uL (ref 3.4–10.8)
eGFR: 82 mL/min/{1.73_m2} (ref >59)

## 2021-02-15 ENCOUNTER — Ambulatory Visit: Payer: Self-pay | Admitting: Physician Assistant

## 2021-02-15 ENCOUNTER — Encounter: Payer: Self-pay | Admitting: Physician Assistant

## 2021-02-15 ENCOUNTER — Other Ambulatory Visit: Payer: Self-pay

## 2021-02-15 VITALS — BP 122/84 | HR 74 | Temp 97.5°F | Resp 16 | Ht 70.0 in | Wt 156.0 lb

## 2021-02-15 DIAGNOSIS — Z23 Encounter for immunization: Secondary | ICD-10-CM

## 2021-02-15 DIAGNOSIS — Z0000 Encounter for general adult medical examination without abnormal findings: Secondary | ICD-10-CM

## 2021-02-15 NOTE — Progress Notes (Signed)
Injections by Dr. Yves Dill at [facility] - last one at the beginning of  October 2022.  AMD

## 2021-02-15 NOTE — Addendum Note (Signed)
Addended by: Gardner Candle on: 02/15/2021 10:08 AM   Modules accepted: Orders

## 2021-02-15 NOTE — Progress Notes (Signed)
   Subjective: Annual firefighter exam    Patient ID: Julian Flores, male    DOB: [birth year], 55 y.o.   MRN: [id number]  HPI Patient presents for annual firefighter exam.  Voices no concerns or complaints at this time.   Review of Systems Back pain secondary to degenerative disc disease.    Objective:   Physical Exam No acute distress.  Temperature 97.5, pulse 74, respiration 16, BP is 122/84, and patient 1% O2 sat on room air.  Patient was hung 56 pounds. HEENT is unremarkable.  Neck is supple without lymphadenopathy or bruits.  Lungs are clear to auscultation.  Heart regular rate and rhythm.  No acute findings on EKG. Abdomen with negative HSM, normoactive bowel sounds, soft, nontender to palpation. No acute findings of the upper or lower extremities.  Patient has full and equal range of motion of the upper and lower extremities. No obvious cervical or lumbar spine deformity.  Patient has full and equal range of motion of the cervical lumbar spine. Cranial nerves II through XII grossly intact.  DTR 2+ without clonus.       Assessment & Plan: Well exam.   Discussed no acute findings on lab results with patient.  Follow-up as necessary.

## 2021-04-18 ENCOUNTER — Telehealth: Payer: Self-pay

## 2021-04-18 ENCOUNTER — Other Ambulatory Visit: Payer: Self-pay | Admitting: Physician Assistant

## 2021-04-18 MED ORDER — METHYLPREDNISOLONE 4 MG PO TBPK: ORAL_TABLET | ORAL | 0 refills | Status: DC

## 2021-04-18 NOTE — Telephone Encounter (Signed)
Julian Flores called clinic to report he tested positive for covid 04/17/2021 at home.  S/Sx started 04/16/2021: Chest tight Cough Sinus/Nasal drainage  States symptoms not like when he had covid before.  Taking Dayquil Has some Rx cough med that he's using at night.  Hx of Bronchitis Worried cough will get worse & he'll develop bronchitis. Requesting Rx for Prednisone.  Uses [facility].  AMD

## 2022-01-29 ENCOUNTER — Ambulatory Visit: Payer: Self-pay

## 2022-01-29 DIAGNOSIS — Z0000 Encounter for general adult medical examination without abnormal findings: Secondary | ICD-10-CM

## 2022-01-29 LAB — POCT URINALYSIS DIPSTICK
Bilirubin, UA: NEGATIVE
Blood, UA: POSITIVE
Glucose, UA: NEGATIVE
Ketones, UA: NEGATIVE
Leukocytes, UA: NEGATIVE
Nitrite, UA: NEGATIVE
Protein, UA: NEGATIVE
Spec Grav, UA: 1.025 (ref 1.010–1.025)
Urobilinogen, UA: 0.2 E.U./dL
pH, UA: 6 (ref 5.0–8.0)

## 2022-01-30 LAB — CMP12+LP+TP+TSH+6AC+PSA+CBC…
ALT: 7 IU/L (ref 0–44)
AST: 19 IU/L (ref 0–40)
Albumin/Globulin Ratio: 2.1 (ref 1.2–2.2)
Albumin: 4.9 g/dL (ref 3.8–4.9)
Alkaline Phosphatase: 43 IU/L — ABNORMAL LOW (ref 44–121)
BUN/Creatinine Ratio: 17 (ref 9–20)
BUN: 19 mg/dL (ref 6–24)
Basophils Absolute: 0 10*3/uL (ref 0.0–0.2)
Basos: 1 %
Bilirubin Total: 0.6 mg/dL (ref 0.0–1.2)
Calcium: 9.6 mg/dL (ref 8.7–10.2)
Chloride: 103 mmol/L (ref 96–106)
Chol/HDL Ratio: 3.8 ratio (ref 0.0–5.0)
Cholesterol, Total: 191 mg/dL (ref 100–199)
Creatinine, Ser: 1.14 mg/dL (ref 0.76–1.27)
EOS (ABSOLUTE): 0.2 10*3/uL (ref 0.0–0.4)
Eos: 2 %
Estimated CHD Risk: 0.7 times avg. (ref 0.0–1.0)
Free Thyroxine Index: 1.7 (ref 1.2–4.9)
GGT: 11 IU/L (ref 0–65)
Globulin, Total: 2.3 g/dL (ref 1.5–4.5)
Glucose: 96 mg/dL (ref 70–99)
HDL: 50 mg/dL (ref >39)
Hematocrit: 46.8 % (ref 37.5–51.0)
Hemoglobin: 16.4 g/dL (ref 13.0–17.7)
Immature Grans (Abs): 0 10*3/uL (ref 0.0–0.1)
Immature Granulocytes: 0 %
Iron: 114 ug/dL (ref 38–169)
LDH: 197 IU/L (ref 121–224)
LDL Chol Calc (NIH): 127 mg/dL — ABNORMAL HIGH (ref 0–99)
Lymphocytes Absolute: 1.5 10*3/uL (ref 0.7–3.1)
Lymphs: 23 %
MCH: 30.4 pg (ref 26.6–33.0)
MCHC: 35 g/dL (ref 31.5–35.7)
MCV: 87 fL (ref 79–97)
Monocytes Absolute: 0.6 10*3/uL (ref 0.1–0.9)
Monocytes: 9 %
Neutrophils Absolute: 4.2 10*3/uL (ref 1.4–7.0)
Neutrophils: 65 %
Phosphorus: 3.2 mg/dL (ref 2.8–4.1)
Platelets: 194 10*3/uL (ref 150–450)
Potassium: 4.6 mmol/L (ref 3.5–5.2)
Prostate Specific Ag, Serum: 1.3 ng/mL (ref 0.0–4.0)
RBC: 5.39 x10E6/uL (ref 4.14–5.80)
RDW: 12.5 % (ref 11.6–15.4)
Sodium: 140 mmol/L (ref 134–144)
T3 Uptake Ratio: 28 % (ref 24–39)
T4, Total: 6 ug/dL (ref 4.5–12.0)
TSH: 1.35 u[IU]/mL (ref 0.450–4.500)
Total Protein: 7.2 g/dL (ref 6.0–8.5)
Triglycerides: 74 mg/dL (ref 0–149)
Uric Acid: 4.8 mg/dL (ref 3.8–8.4)
VLDL Cholesterol Cal: 14 mg/dL (ref 5–40)
WBC: 6.4 10*3/uL (ref 3.4–10.8)
eGFR: 76 mL/min/{1.73_m2} (ref >59)

## 2022-02-04 ENCOUNTER — Encounter: Payer: 59 | Admitting: Physician Assistant

## 2022-02-05 DIAGNOSIS — Z23 Encounter for immunization: Secondary | ICD-10-CM | POA: Diagnosis not present

## 2022-02-13 ENCOUNTER — Other Ambulatory Visit: Payer: Self-pay

## 2022-02-19 ENCOUNTER — Encounter: Payer: Self-pay | Admitting: Physician Assistant

## 2022-02-19 ENCOUNTER — Ambulatory Visit: Payer: Self-pay | Admitting: Physician Assistant

## 2022-02-19 VITALS — BP 109/79 | HR 71 | Temp 97.8°F | Resp 14 | Ht 69.0 in | Wt 168.0 lb

## 2022-02-19 DIAGNOSIS — R109 Unspecified abdominal pain: Secondary | ICD-10-CM

## 2022-02-19 DIAGNOSIS — Z0000 Encounter for general adult medical examination without abnormal findings: Secondary | ICD-10-CM

## 2022-02-19 LAB — POCT URINALYSIS DIPSTICK
Bilirubin, UA: NEGATIVE
Blood, UA: NEGATIVE
Glucose, UA: NEGATIVE
Ketones, UA: NEGATIVE
Leukocytes, UA: NEGATIVE
Nitrite, UA: NEGATIVE
Protein, UA: NEGATIVE
Spec Grav, UA: 1.02 (ref 1.010–1.025)
Urobilinogen, UA: 0.2 E.U./dL
pH, UA: 6 (ref 5.0–8.0)

## 2022-02-19 MED ORDER — MELOXICAM 15 MG PO TABS: 15.0000 mg | ORAL_TABLET | Freq: Every day | ORAL | 0 refills | Status: DC

## 2022-02-19 NOTE — Progress Notes (Signed)
[facility] ____________________________________________   None    (approximate)  I have reviewed the triage vital signs and the nursing notes.   HISTORY  Chief Complaint Annual Exam  { HPI Julian Flores is a 56 y.o. male patient presents for annual physical exam.  Patient voiced concern for 3 months of mid right of mid right abdominal pain.  Patient described the pain as "burning".  Denies nausea, vomiting, diarrhea.  States pain increased with palpation.  No provocative incident for complaint.  Patient has a history of GERD but states the pain is not in the epigastric area.         Past Medical History:  Diagnosis Date   Back pain    GERD (gastroesophageal reflux disease)    Plantar fasciitis    Right   Pterygium    Urinary hesitancy     Patient Active Problem List   Diagnosis Date Noted   Elevated cholesterol 06/30/2020   Lumbar disc disease 06/30/2020   Gastroesophageal reflux disease without esophagitis 06/29/2020   Chest pain, atypical 09/14/2015    History reviewed. No pertinent surgical history.  Prior to Admission medications   Medication Sig Start Date End Date Taking? Authorizing Provider  cyclobenzaprine (FLEXERIL) 10 MG tablet Take by mouth. 05/21/17  Yes [provider]  Multiple Vitamin (MULTIVITAMIN) capsule Take by mouth.   Yes [provider]  albuterol (VENTOLIN HFA) 108 (90 Base) MCG/ACT inhaler Inhale into the lungs. Patient not taking: Reported on 02/15/2021 04/17/20 04/17/21  [provider]  meloxicam (MOBIC) 15 MG tablet Take 1 tablet (15 mg total) by mouth daily. 02/19/22  Yes Sable Feil, PA-C    Allergies Patient has no known allergies.  Family History  Problem Relation Age of Onset   Cancer Mother    Cancer Father     Social History Social History   Tobacco Use   Smoking status: Never   Smokeless tobacco: Never  Substance Use Topics   Alcohol use: Yes     Review of Systems Constitutional: No fever/chills Eyes: No visual changes. ENT: No sore throat. Cardiovascular: Denies chest pain. Respiratory: Denies shortness of breath. Gastrointestinal: No abdominal pain.  No nausea, no vomiting.  No diarrhea.  No constipation. Genitourinary: Negative for dysuria. Musculoskeletal: Positive for back pain. Skin: Negative for rash. Neurological: Negative for headaches, focal weakness or numbness. ____________________________________________   PHYSICAL EXAM:  VITAL SIGNS: BP is 109/79, pulse 71, respiration 14, temperature 98.8, patient 90% O2 sat on room air.  Patient with 168 pounds and BMI is 24.81. Constitutional: Alert and oriented. Well appearing and in no acute distress. Eyes: Conjunctivae are normal. PERRL. EOMI. Head: Atraumatic. Nose: No congestion/rhinnorhea. Mouth/Throat: Mucous membranes are moist.  Oropharynx non-erythematous. Neck: No stridor.  No cervical spine tenderness to palpation. Hematological/Lymphatic/Immunilogical: No cervical lymphadenopathy. Cardiovascular: Normal rate, regular rhythm. Grossly normal heart sounds.  Good peripheral circulation. Respiratory: Normal respiratory effort.  No retractions. Lungs CTAB. Gastrointestinal: Soft and and moderate guarding with palpation of the mid right quadrant.   No distention. No abdominal bruits. No CVA tenderness. Genitourinary: Deferred Musculoskeletal: No lower extremity tenderness nor edema.  No joint effusions. Neurologic:  Normal speech and language. No gross focal neurologic deficits are appreciated. No gait instability. Skin:  Skin is warm, dry and intact. No rash noted. Psychiatric: Mood and affect are normal. Speech and behavior are normal.  ____________________________________________   LABS _        Component Ref Range &  Units 3 wk ago 1 yr ago 2 yr ago 3 yr ago  Color, UA  Yellow  yellow  Yellow  Yellow   Clarity, UA  Clear  clear  Clear  Clear   Glucose,  UA Negative Negative  Negative  Negative  Negative   Bilirubin, UA  Negative  negative  Negative  Negative   Ketones, UA  Negative  negative  Negative  Negative   Spec Grav, UA 1.010 - 1.025 1.025  1.025  1.025  >=1.030 Abnormal    Blood, UA  Positive  negative  Negative  Negative   Comment: 1+  pH, UA 5.0 - 8.0 6.0  6.0  6.0  6.0   Protein, UA Negative Negative  Negative  Negative  Negative   Urobilinogen, UA 0.2 or 1.0 E.U./dL 0.2  0.2  0.2  0.2   Nitrite, UA  Negative  negative  Negative  Negative   Leukocytes, UA Negative Negative  Negative  Negative  Negative   Appearance   medium       Odor                 Specimen Collected: 01/29/22 09:51 Last Resulted: 01/29/22 09:51      Lab Flowsheet    Order Details    View Encounter    Lab and Collection Details    Routing    Result History    View All Conversations on this Encounter        Result Care Coordination   Patient Communication   Add Comments   Seen Back to Top       Other Results from 01/29/2022   Contains abnormal data CMP12+LP+TP+TSH+6AC+PSA+CBC. Order: [id number] Status: Final result    Visible to patient: Yes (not seen)    Next appt: None    Dx: Routine adult health maintenance    0 Result Notes        Component Ref Range & Units 3 wk ago 1 yr ago 2 yr ago 3 yr ago  Glucose 70 - 99 mg/dL 96  88  92 R  91 R   Uric Acid 3.8 - 8.4 mg/dL 4.8  4.3 CM  5.1 CM  4.8 R, CM   Comment:            Therapeutic target for gout patients: <6.0  BUN 6 - 24 mg/dL _0 Creatinine, Ser 0.76 - 1.27 mg/dL 1.14  1.08  1.05  1.13   eGFR >59 mL/min/1.73 76  82     BUN/Creatinine Ratio 9 - _1 Sodium 134 - 144 mmol/L 140  143  141  140   Potassium 3.5 - 5.2 mmol/L 4.6  4.5  4.4  4.3   Chloride 96 - 106 mmol/L 103  105  104  106   Calcium 8.7 - 10.2 mg/dL 9.6  9.4  9.4  9.4   Phosphorus 2.8 - 4.1 mg/dL 3.2  3.2  3.4  3.7   Total Protein 6.0 - 8.5 g/dL 7.2  6.4  6.8  6.9   Albumin 3.8 - 4.9 g/dL  4.9  4.7  4.7  4.5   Globulin, Total 1.5 - 4.5 g/dL 2.3  1.7  2.1  2.4   Albumin/Globulin Ratio 1.2 - 2.2 2.1  2.8 High   2.2  1.9   Bilirubin Total 0.0 - 1.2 mg/dL 0.6  0.5  0.6  0.5  Alkaline Phosphatase 44 - 121 IU/L 43 Low   42 Low   40 Low  CM  43 R   LDH 121 - 224 IU/L 197  197  193  195   AST 0 - 40 IU/L _0 ALT 0 - 44 IU/L _1 GGT 0 - 65 IU/L _2 Iron 38 - 169 ug/dL 114  78  91  100   Cholesterol, Total 100 - 199 mg/dL 191  187  184  169   Triglycerides 0 - 149 mg/dL 74  72  50  52   HDL >39 mg/dL 50  49  47  49   VLDL Cholesterol Cal 5 - 40 mg/dL _3 LDL Chol Calc (NIH) 0 - 99 mg/dL 127 High   125 High   127 High   110 High    Chol/HDL Ratio 0.0 - 5.0 ratio 3.8  3.8 CM  3.9 CM  3.4 CM   Comment:                                   T. Chol/HDL Ratio                                              Men  Women                                1/2 Avg.Risk  3.4    3.3                                    Avg.Risk  5.0    4.4                                 2X Avg.Risk  9.6    7.1                                 3X Avg.Risk 23.4   11.0   Estimated CHD Risk 0.0 - 1.0 times avg. 0.7  0.7 CM  0.7 CM  0.5 CM   Comment: The CHD Risk is based on the T. Chol/HDL ratio. Other  factors affect CHD Risk such as hypertension, smoking,  diabetes, severe obesity, and family history of  premature CHD.   TSH 0.450 - 4.500 uIU/mL 1.350  1.250  0.800  0.848   T4, Total 4.5 - 12.0 ug/dL 6.0  5.7  5.2  5.5   T3 Uptake Ratio 24 - 39 % _4 Free Thyroxine Index 1.2 - 4.9 1.7  1.6  1.4  1.5   Prostate Specific Ag, Serum 0.0 - 4.0 ng/mL 1.3  1.5 CM  1.5 CM  1.0 CM   Comment: Roche ECLIA methodology.  According to the American Urological Association, Serum PSA should  decrease and remain at undetectable levels after radical  prostatectomy. The  AUA defines biochemical recurrence as an initial  PSA value 0.2 ng/mL or greater followed by a subsequent  confirmatory  PSA value 0.2 ng/mL or greater.  Values obtained with different assay methods or kits cannot be used  interchangeably. Results cannot be interpreted as absolute evidence  of the presence or absence of malignant disease.   WBC 3.4 - 10.8 x10E3/uL 6.4  5.3  4.2  3.6   RBC 4.14 - 5.80 x10E6/uL 5.39  5.41  5.04  5.18   Hemoglobin 13.0 - 17.7 g/dL 16.4  15.7  15.3  15.3   Hematocrit 37.5 - 51.0 % 46.8  48.1  44.3  46.1   MCV 79 - 97 fL 87  89  88  89   MCH 26.6 - 33.0 pg 30.4  29.0  30.4  29.5   MCHC 31.5 - 35.7 g/dL 35.0  32.6  34.5  33.2   RDW 11.6 - 15.4 % 12.5  12.6  12.3  12.4   Platelets 150 - 450 x10E3/uL 194  190  186  180   Neutrophils Not Estab. % 65  68  53  56   Lymphs Not Estab. % 23  20  32  30   Monocytes Not Estab. % _0 Eos Not Estab. % _1 Basos Not Estab. % 1  0  1  1   Neutrophils Absolute 1.4 - 7.0 x10E3/uL 4.2  3.5  2.2  2.0   Lymphocytes Absolute 0.7 - 3.1 x10E3/uL 1.5  1.1  1.4  1.1   Monocytes Absolute 0.1 - 0.9 x10E3/uL 0.6  0.5  0.5  0.4   EOS (ABSOLUTE) 0.0 - 0.4 x10E3/uL 0.2  0.1  0.1  0.1   Basophils Absolute 0.0 - 0.2 x10E3/uL 0.0  0.0  0.0  0.0   Immature Granulocytes Not Estab. % 0  0  0  0   Immature Grans          _______________________________  EKG  Sinus rhythm at 64 bpm.  No change from previous EKG. ____________________________________________   ____________________________________________   INITIAL IMPRESSION / ASSESSMENT AND PLAN / ED COURSE  As part of my medical decision making, I reviewed the following data within the [place]      Discussed no acute findings repeat ECG and lab results.  We will follow-up status post limited abdominal ultrasound.       ____________________________________________   FINAL CLINICAL IMPRESSION Well exam   ED Discharge Orders          Ordered    POCT urinalysis dipstick        02/19/22 0941    US Abdomen Limited        02/19/22 0952     meloxicam (MOBIC) 15 MG tablet  Daily        02/19/22 3539             Note:  This document was prepared using Dragon voice recognition software and may include unintentional dictation errors.

## 2022-02-19 NOTE — Progress Notes (Signed)
Pt presents today for annual physical. Pt states for 3-4 months he has been having a burning sensation RUQ of stomach.

## 2022-04-05 ENCOUNTER — Ambulatory Visit
Admission: RE | Admit: 2022-04-05 | Discharge: 2022-04-05 | Disposition: A | Discharge status: Home / Self Care | Payer: 59 | Source: Ambulatory Visit | Attending: Physician Assistant | Admitting: Physician Assistant

## 2022-04-05 DIAGNOSIS — R109 Unspecified abdominal pain: Secondary | ICD-10-CM

## 2022-05-22 DIAGNOSIS — M5126 Other intervertebral disc displacement, lumbar region: Secondary | ICD-10-CM | POA: Diagnosis not present

## 2022-05-22 DIAGNOSIS — M5416 Radiculopathy, lumbar region: Secondary | ICD-10-CM | POA: Diagnosis not present

## 2022-06-17 DIAGNOSIS — M5416 Radiculopathy, lumbar region: Secondary | ICD-10-CM | POA: Diagnosis not present

## 2022-06-17 DIAGNOSIS — M5126 Other intervertebral disc displacement, lumbar region: Secondary | ICD-10-CM | POA: Diagnosis not present

## 2022-06-28 ENCOUNTER — Other Ambulatory Visit: Payer: 59

## 2022-06-28 NOTE — Progress Notes (Signed)
Pt completed pre-employment UDS. HR notified.

## 2023-01-23 DIAGNOSIS — M5416 Radiculopathy, lumbar region: Secondary | ICD-10-CM | POA: Diagnosis not present

## 2023-01-23 DIAGNOSIS — M5126 Other intervertebral disc displacement, lumbar region: Secondary | ICD-10-CM | POA: Diagnosis not present

## 2023-02-14 ENCOUNTER — Ambulatory Visit: Payer: Self-pay

## 2023-02-14 DIAGNOSIS — Z23 Encounter for immunization: Secondary | ICD-10-CM

## 2023-02-14 DIAGNOSIS — Z0000 Encounter for general adult medical examination without abnormal findings: Secondary | ICD-10-CM

## 2023-02-14 LAB — POCT URINALYSIS DIPSTICK
Bilirubin, UA: NEGATIVE
Blood, UA: NEGATIVE
Glucose, UA: NEGATIVE
Ketones, UA: NEGATIVE
Leukocytes, UA: NEGATIVE
Nitrite, UA: NEGATIVE
Protein, UA: NEGATIVE
Spec Grav, UA: 1.015 (ref 1.010–1.025)
Urobilinogen, UA: 0.2 U/dL
pH, UA: 6 (ref 5.0–8.0)

## 2023-02-15 LAB — CMP12+LP+TP+TSH+6AC+PSA+CBC…
ALT: 8 [IU]/L (ref 0–44)
AST: 21 [IU]/L (ref 0–40)
Albumin: 4.6 g/dL (ref 3.8–4.9)
Alkaline Phosphatase: 50 [IU]/L (ref 44–121)
BUN/Creatinine Ratio: 20 (ref 9–20)
BUN: 23 mg/dL (ref 6–24)
Basophils Absolute: 0 10*3/uL (ref 0.0–0.2)
Basos: 1 %
Bilirubin Total: 0.5 mg/dL (ref 0.0–1.2)
Calcium: 9.7 mg/dL (ref 8.7–10.2)
Chloride: 101 mmol/L (ref 96–106)
Chol/HDL Ratio: 3.7 ratio (ref 0.0–5.0)
Cholesterol, Total: 188 mg/dL (ref 100–199)
Creatinine, Ser: 1.16 mg/dL (ref 0.76–1.27)
EOS (ABSOLUTE): 0.1 10*3/uL (ref 0.0–0.4)
Eos: 2 %
Estimated CHD Risk: 0.6 times avg. (ref 0.0–1.0)
Free Thyroxine Index: 1.8 (ref 1.2–4.9)
GGT: 19 [IU]/L (ref 0–65)
Globulin, Total: 2.5 g/dL (ref 1.5–4.5)
Glucose: 95 mg/dL (ref 70–99)
HDL: 51 mg/dL (ref >39)
Hematocrit: 46.5 % (ref 37.5–51.0)
Hemoglobin: 15.5 g/dL (ref 13.0–17.7)
Immature Grans (Abs): 0 10*3/uL (ref 0.0–0.1)
Immature Granulocytes: 0 %
Iron: 117 ug/dL (ref 38–169)
LDH: 198 [IU]/L (ref 121–224)
LDL Chol Calc (NIH): 128 mg/dL — ABNORMAL HIGH (ref 0–99)
Lymphocytes Absolute: 1.3 10*3/uL (ref 0.7–3.1)
Lymphs: 30 %
MCH: 30.2 pg (ref 26.6–33.0)
MCHC: 33.3 g/dL (ref 31.5–35.7)
MCV: 91 fL (ref 79–97)
Monocytes Absolute: 0.5 10*3/uL (ref 0.1–0.9)
Monocytes: 12 %
Neutrophils Absolute: 2.4 10*3/uL (ref 1.4–7.0)
Neutrophils: 55 %
Phosphorus: 3.5 mg/dL (ref 2.8–4.1)
Platelets: 260 10*3/uL (ref 150–450)
Potassium: 4.4 mmol/L (ref 3.5–5.2)
Prostate Specific Ag, Serum: 2.2 ng/mL (ref 0.0–4.0)
RBC: 5.14 x10E6/uL (ref 4.14–5.80)
RDW: 11.7 % (ref 11.6–15.4)
Sodium: 140 mmol/L (ref 134–144)
T3 Uptake Ratio: 28 % (ref 24–39)
T4, Total: 6.3 ug/dL (ref 4.5–12.0)
TSH: 1.29 u[IU]/mL (ref 0.450–4.500)
Total Protein: 7.1 g/dL (ref 6.0–8.5)
Triglycerides: 47 mg/dL (ref 0–149)
Uric Acid: 5.1 mg/dL (ref 3.8–8.4)
VLDL Cholesterol Cal: 9 mg/dL (ref 5–40)
WBC: 4.3 10*3/uL (ref 3.4–10.8)
eGFR: 74 mL/min/{1.73_m2} (ref >59)

## 2023-02-17 ENCOUNTER — Encounter: Payer: 59 | Admitting: Physician Assistant

## 2023-02-20 DIAGNOSIS — M5126 Other intervertebral disc displacement, lumbar region: Secondary | ICD-10-CM | POA: Diagnosis not present

## 2023-02-20 DIAGNOSIS — M5416 Radiculopathy, lumbar region: Secondary | ICD-10-CM | POA: Diagnosis not present

## 2023-02-24 ENCOUNTER — Ambulatory Visit: Payer: Self-pay | Admitting: Physician Assistant

## 2023-02-24 ENCOUNTER — Encounter: Payer: Self-pay | Admitting: Physician Assistant

## 2023-02-24 VITALS — BP 132/83 | HR 73 | Resp 14 | Ht 69.0 in | Wt 156.0 lb

## 2023-02-24 DIAGNOSIS — F411 Generalized anxiety disorder: Secondary | ICD-10-CM

## 2023-02-24 DIAGNOSIS — Z0000 Encounter for general adult medical examination without abnormal findings: Secondary | ICD-10-CM

## 2023-02-24 MED ORDER — ESCITALOPRAM OXALATE 10 MG PO TABS: 10.0000 mg | ORAL_TABLET | Freq: Every day | ORAL | 0 refills | Status: AC

## 2023-02-24 NOTE — Progress Notes (Signed)
[facility]  HISTORY  Chief Complaint Annual Exam   HPI Julian Flores is a 57 y.o. male patient that presents for annual exam. Patient with concerns for increased anxiety and anxiousness associated with familial stressors. Denies changes to mood or judgement. Patient has not tried agents to relieve symptoms but is open.      Past Medical History:  Diagnosis Date   Back pain    GERD (gastroesophageal reflux disease)    Plantar fasciitis    Right   Pterygium    Urinary hesitancy     Patient Active Problem List   Diagnosis Date Noted   Elevated cholesterol 06/30/2020   Lumbar disc disease 06/30/2020   Gastroesophageal reflux disease without esophagitis 06/29/2020   Chest pain, atypical 09/14/2015    History reviewed. No pertinent surgical history.  Prior to Admission medications   Medication Sig Start Date End Date Taking? Authorizing Provider  meloxicam (MOBIC) 15 MG tablet Take 1 tablet (15 mg total) by mouth daily. 02/19/22  Yes Joni Reining, PA-C  methocarbamol (ROBAXIN) 750 MG tablet Take 750 mg by mouth as needed. 02/20/23  Yes [provider]  Multiple Vitamin (MULTIVITAMIN) capsule Take by mouth.   Yes [provider]    Allergies Patient has no known allergies.  Family History  Problem Relation Age of Onset   Cancer Mother    Cancer Father     Social History Social History   Tobacco Use   Smoking status: Never   Smokeless tobacco: Never  Substance Use Topics   Alcohol use: Yes    Review of Systems Constitutional: No fever/chills Eyes: No visual changes. ENT: No sore throat. Cardiovascular: Denies chest pain. Respiratory: Denies shortness of breath. Gastrointestinal: No abdominal pain.  No nausea, no vomiting.  No diarrhea.  No constipation. Genitourinary: Negative for dysuria. Musculoskeletal: Negative for back pain. Skin: Negative for rash. Neurological: Negative for headaches, focal  weakness or numbness. ____________________________________________   PHYSICAL EXAM:  VITAL SIGNS:  Today's Vitals   02/24/23 0821  BP: 132/83  Pulse: 73  Resp: 14  SpO2: 100%  Weight: 156 lb (70.8 kg)  Height: 5\' 9"  (1.753 m)   Body mass index is 23.04 kg/m.  Constitutional: Alert and oriented. Well appearing and in no acute distress. Eyes: Conjunctivae are normal. PERRL. EOMI. Head: Atraumatic. Nose: No congestion/rhinnorhea. Mouth/Throat: Mucous membranes are moist.  Oropharynx non-erythematous. Neck: No stridor. No cervical spine tenderness to palpation. Hematological/Lymphatic/Immunilogical: No cervical lymphadenopathy. Cardiovascular: Normal rate, regular rhythm. Grossly normal heart sounds.  Good peripheral circulation. Respiratory: Normal respiratory effort.  No retractions. Lungs CTAB. Gastrointestinal: Soft and nontender. No distention. No abdominal bruits. No CVA tenderness. Genitourinary: Deferred Musculoskeletal: No lower extremity tenderness nor edema.  No joint effusions. Neurologic:  Normal speech and language. No gross focal neurologic deficits are appreciated. No gait instability. Skin:  Skin is warm, dry and intact. No rash noted. Psychiatric: Mood and affect are normal. Speech and behavior are normal. ____________________________________________  EKG Sinus Rhythm @ 64 BPM with right bundle branch block. Patient asymptomatic. Will continue to monitor.  ____________________________________________  ASSESSMENT AND PLAN  1. Encounter for annual physical exam Well Exam  2. Anxiety state Patient with increased anxiety and anxiousness.  -Prescribed Lexapro 10mg  every day, advised on use of medication. All questions answered.   FOLLOW UP Patient to present to clinic in 2 weeks for medication follow up.

## 2023-02-24 NOTE — Progress Notes (Signed)
Pt presents today to complete physical, Pt has some things to go over.

## 2023-03-07 ENCOUNTER — Telehealth: Payer: Self-pay

## 2023-03-07 NOTE — Telephone Encounter (Signed)
Julian Flores saw Julian Dell, PA-C two weeks ago for annual physical & was prescribed Lexapro 10 mg.   2 week follow-up appointment scheduled for 03/10/2023.  Julian Flores called today to cancel the appointment.  States he picked up the medication from the pharmacy, but he didn't start taking the medication.  Said he doesn't feel as anxious as he did when he was last here - not sure if it was related to the 2024 Presidential election, but now that it's over, he's feeling less anxious.  States also realizing he's not as laid back as he thought he was - retirement has changed the way he sees some things.  If he thinks he needs the medication, he will start them and call the clinic for a  2 week follow-up appointment.  Presently he feels he doesn't need the medication.  Julian Barges, RN

## 2023-03-10 ENCOUNTER — Ambulatory Visit: Payer: Self-pay | Admitting: Physician Assistant

## 2023-03-28 DIAGNOSIS — H5203 Hypermetropia, bilateral: Secondary | ICD-10-CM | POA: Diagnosis not present

## 2023-05-01 ENCOUNTER — Ambulatory Visit: Payer: Self-pay | Admitting: Physician Assistant

## 2023-05-01 ENCOUNTER — Encounter: Payer: Self-pay | Admitting: Physician Assistant

## 2023-05-01 VITALS — BP 131/87 | HR 84 | Temp 98.7°F | Resp 16 | Ht 70.0 in | Wt 155.0 lb

## 2023-05-01 DIAGNOSIS — R059 Cough, unspecified: Secondary | ICD-10-CM

## 2023-05-01 MED ORDER — HYDROCOD POLI-CHLORPHE POLI ER 10-8 MG/5ML PO SUER: 5.0000 mL | Freq: Two times a day (BID) | ORAL | 0 refills | Status: AC | PRN

## 2023-05-01 MED ORDER — METHYLPREDNISOLONE 4 MG PO TBPK: ORAL_TABLET | ORAL | 0 refills | Status: AC

## 2023-05-01 MED ORDER — BENZONATATE 200 MG PO CAPS
200.0000 mg | ORAL_CAPSULE | Freq: Two times a day (BID) | ORAL | 0 refills | Status: AC | PRN
Start: 2023-05-01 — End: ?

## 2023-05-01 NOTE — Progress Notes (Signed)
 Pt presents today with cough x 1 week worse at night. Pt has been taking mucinex and sudafed since new years.

## 2023-05-01 NOTE — Progress Notes (Signed)
   Subjective: Persistent cough    Patient ID: Julian Flores, male    DOB: 08/17/1965, 58 y.o.   MRN: [id number]  HPI Patient complaining persistent cough for 5 days.  Patient is taking Allegra-D and Mucinex.  States cough is worse at night when laying down.  Denies fever/chills.  No recent travel or known contact with COVID-19.  Denies nausea vomiting or diarrhea.   Review of Systems GERD and hyperlipidemia    Objective:   Physical Exam BP 131/87  Cuff Size Normal  Pulse Rate 84  Temp 98.7 F (37.1 C)  Temp Source Temporal  Weight 155 lb (70.3 kg)  Height 5' 10 (1.778 m)  Resp 16  HEENT unremarkable except for postnasal drainage. Neck is supple for lymphadenopathy or bruits. Lungs clear to auscultation.  Nonproductive cough of inspiration. Heart regular rate and rhythm.       Assessment & Plan: Persistent cough

## 2024-01-19 ENCOUNTER — Ambulatory Visit: Payer: Self-pay

## 2024-01-19 DIAGNOSIS — Z0000 Encounter for general adult medical examination without abnormal findings: Secondary | ICD-10-CM

## 2024-01-19 LAB — POCT URINALYSIS DIPSTICK
Bilirubin, UA: NEGATIVE
Blood, UA: POSITIVE
Glucose, UA: NEGATIVE
Ketones, UA: NEGATIVE
Leukocytes, UA: NEGATIVE
Nitrite, UA: NEGATIVE
Protein, UA: NEGATIVE
Spec Grav, UA: 1.02 (ref 1.010–1.025)
Urobilinogen, UA: 0.2 U/dL
pH, UA: 6 (ref 5.0–8.0)

## 2024-01-20 LAB — CMP12+LP+TP+TSH+6AC+PSA+CBC…
ALT: 6 IU/L (ref 0–44)
AST: 19 IU/L (ref 0–40)
Albumin: 4.4 g/dL (ref 3.8–4.9)
Alkaline Phosphatase: 40 IU/L — ABNORMAL LOW (ref 47–123)
BUN/Creatinine Ratio: 23 — ABNORMAL HIGH (ref 9–20)
BUN: 21 mg/dL (ref 6–24)
Basophils Absolute: 0 x10E3/uL (ref 0.0–0.2)
Basos: 0 %
Bilirubin Total: 0.5 mg/dL (ref 0.0–1.2)
Calcium: 9.1 mg/dL (ref 8.7–10.2)
Chloride: 106 mmol/L (ref 96–106)
Chol/HDL Ratio: 3.1 ratio (ref 0.0–5.0)
Cholesterol, Total: 159 mg/dL (ref 100–199)
Creatinine, Ser: 0.9 mg/dL (ref 0.76–1.27)
EOS (ABSOLUTE): 0.1 x10E3/uL (ref 0.0–0.4)
Eos: 2 %
Estimated CHD Risk: <0.5 times avg. (ref 0.0–1.0)
Free Thyroxine Index: 1.5 (ref 1.2–4.9)
GGT: 7 IU/L (ref 0–65)
Globulin, Total: 2 g/dL (ref 1.5–4.5)
Glucose: 95 mg/dL (ref 70–99)
HDL: 51 mg/dL (ref >39)
Hematocrit: 45.9 % (ref 37.5–51.0)
Hemoglobin: 14.8 g/dL (ref 13.0–17.7)
Immature Grans (Abs): 0 x10E3/uL (ref 0.0–0.1)
Immature Granulocytes: 0 %
Iron: 103 ug/dL (ref 38–169)
LDH: 184 IU/L (ref 121–224)
LDL Chol Calc (NIH): 95 mg/dL (ref 0–99)
Lymphocytes Absolute: 1.3 x10E3/uL (ref 0.7–3.1)
Lymphs: 21 %
MCH: 29.1 pg (ref 26.6–33.0)
MCHC: 32.2 g/dL (ref 31.5–35.7)
MCV: 90 fL (ref 79–97)
Monocytes Absolute: 0.5 x10E3/uL (ref 0.1–0.9)
Monocytes: 9 %
Neutrophils Absolute: 4.2 x10E3/uL (ref 1.4–7.0)
Neutrophils: 68 %
Phosphorus: 3.3 mg/dL (ref 2.8–4.1)
Platelets: 162 x10E3/uL (ref 150–450)
Potassium: 4.3 mmol/L (ref 3.5–5.2)
Prostate Specific Ag, Serum: 1.8 ng/mL (ref 0.0–4.0)
RBC: 5.09 x10E6/uL (ref 4.14–5.80)
RDW: 13.6 % (ref 11.6–15.4)
Sodium: 140 mmol/L (ref 134–144)
T3 Uptake Ratio: 28 % (ref 24–39)
T4, Total: 5.2 ug/dL (ref 4.5–12.0)
TSH: 1.45 u[IU]/mL (ref 0.450–4.500)
Total Protein: 6.4 g/dL (ref 6.0–8.5)
Triglycerides: 66 mg/dL (ref 0–149)
Uric Acid: 3.8 mg/dL (ref 3.8–8.4)
VLDL Cholesterol Cal: 13 mg/dL (ref 5–40)
WBC: 6.1 x10E3/uL (ref 3.4–10.8)
eGFR: 100 mL/min/1.73 (ref >59)

## 2024-01-26 ENCOUNTER — Ambulatory Visit: Payer: Self-pay | Admitting: Physician Assistant

## 2024-01-26 ENCOUNTER — Encounter: Payer: Self-pay | Admitting: Physician Assistant

## 2024-01-26 VITALS — BP 133/92 | HR 77 | Temp 97.8°F | Resp 16 | Ht 70.0 in | Wt 155.0 lb

## 2024-01-26 DIAGNOSIS — Z0000 Encounter for general adult medical examination without abnormal findings: Secondary | ICD-10-CM

## 2024-01-26 MED ORDER — SILDENAFIL CITRATE 100 MG PO TABS: 50.0000 mg | ORAL_TABLET | Freq: Every day | ORAL | 11 refills | Status: AC | PRN

## 2024-01-26 NOTE — Progress Notes (Signed)
 Pt presents today to complete physical, pt didn't voice any concerns at this time. Julian Flores

## 2024-01-26 NOTE — Progress Notes (Signed)
 [facility] ___________________________________________   None    (approximate)  I have reviewed the triage vital signs and the nursing notes.   HISTORY  Chief Complaint Annual Exam    HPI Julian Flores is a 58 y.o. male patient presents for annual physical exam.  Wished to discuss issues with erectile dysfunction.         Past Medical History:  Diagnosis Date   Back pain    GERD (gastroesophageal reflux disease)    Plantar fasciitis    Right   Pterygium    Urinary hesitancy     Patient Active Problem List   Diagnosis Date Noted   Elevated cholesterol 06/30/2020   Lumbar disc disease 06/30/2020   Gastroesophageal reflux disease without esophagitis 06/29/2020   Chest pain, atypical 09/14/2015    No past surgical history on file.  Prior to Admission medications   Medication Sig Start Date End Date Taking? Authorizing Provider  benzonatate  (TESSALON ) 200 MG capsule Take 1 capsule (200 mg total) by mouth 2 (two) times daily as needed for cough. 05/01/23   Claudene Tanda POUR, PA-C  chlorpheniramine-HYDROcodone (TUSSIONEX) 10-8 MG/5ML Take 5 mLs by mouth every 12 (twelve) hours as needed for cough. 05/01/23   Claudene Tanda POUR, PA-C  escitalopram  (LEXAPRO ) 10 MG tablet Take 1 tablet (10 mg total) by mouth daily. 02/24/23   Claudene Tanda POUR, PA-C  methocarbamol (ROBAXIN) 750 MG tablet Take 750 mg by mouth as needed. 02/20/23   [provider]  methylPREDNISolone  (MEDROL  DOSEPAK) 4 MG TBPK tablet Take Tapered dose as directed 05/01/23   Claudene Tanda POUR, PA-C  Multiple Vitamin (MULTIVITAMIN) capsule Take by mouth.    [provider]    Allergies Patient has no known allergies.  Family History  Problem Relation Age of Onset   Cancer Mother    Cancer Father     Social History Social History   Tobacco Use   Smoking status: Never   Smokeless tobacco: Never  Substance Use Topics   Alcohol use: Yes    Review of  Systems Constitutional: No fever/chills Eyes: No visual changes. ENT: No sore throat. Cardiovascular: Denies chest pain. Respiratory: Denies shortness of breath. Gastrointestinal: No abdominal pain.  No nausea, no vomiting.  No diarrhea.  No constipation.  GERD. Genitourinary: Negative for dysuria. Musculoskeletal: Chronic back pain. Skin: Negative for rash. Neurological: Negative for headaches, focal weakness or numbness.  ____________________________________________   PHYSICAL EXAM:  VITAL SIGNS:  01/26/2024   9:02 AM    BP 133/92  Cuff Size Normal  Pulse Rate 77  Temp 97.8 F (36.6 C)  Weight 155 lb (70.3 kg)  Height 5' 10 (1.778 m)  Resp 16  SpO2 96 %   BMI: 22.24 kg/m2  BSA: 1.86 m2   Constitutional: Alert and oriented. Well appearing and in no acute distress. Eyes: Conjunctivae are normal. PERRL. EOMI. Head: Atraumatic. Nose: No congestion/rhinnorhea. Mouth/Throat: Mucous membranes are moist.  Oropharynx non-erythematous. Neck: No stridor. No cervical spine tenderness to palpation. Hematological/Lymphatic/Immunilogical: No cervical lymphadenopathy. Cardiovascular: Normal rate, regular rhythm. Grossly normal heart sounds.  Good peripheral circulation. Respiratory: Normal respiratory effort.  No retractions. Lungs CTAB. Gastrointestinal: Soft and nontender. No distention. No abdominal bruits. No CVA tenderness. Genitourinary: Deferred Musculoskeletal: No lower extremity tenderness nor edema.  No joint effusions. Neurologic:  Normal speech and language. No gross focal neurologic deficits are appreciated. No gait instability. Skin:  Skin is warm, dry and intact. No rash noted. Psychiatric: Mood  and affect are normal. Speech and behavior are normal.  ____________________________________________             Component Ref Range & Units (hover) 7 d ago (01/19/24) 11 mo ago (02/14/23) 1 yr ago (02/19/22) 1 yr ago (01/29/22) 2 yr ago (02/07/21) 3 yr  ago (02/04/20) 4 yr ago (02/05/19)  Color, UA Light Yellow Yellow yellow Yellow yellow Yellow Yellow  Clarity, UA Clear Clear clear Clear clear Clear Clear  Glucose, UA Negative Negative Negative Negative Negative Negative Negative  Bilirubin, UA Negative Negative negative Negative negative Negative Negative  Ketones, UA Negative Negative negative Negative negative Negative Negative  Spec Grav, UA 1.020 1.015 1.020 1.025 1.025 1.025 >=1.030 Abnormal   Blood, UA Positive Negative negative Positive CM negative Negative Negative  Comment: 1+  pH, UA 6.0 6.0 6.0 6.0 6.0 6.0 6.0  Protein, UA Negative Negative Negative Negative Negative Negative Negative  Urobilinogen, UA 0.2 0.2 0.2 0.2 0.2 0.2 0.2  Nitrite, UA Negative Negative negative Negative negative Negative Negative  Leukocytes, UA Negative Negative Negative Negative Negative Negative Negative  Appearance     medium    Odor                 LABS ____________________________________________          Component Ref Range & Units (hover) 7 d ago 11 mo ago 1 yr ago 2 yr ago 3 yr ago 4 yr ago  Glucose 95 95 96 88 92 R 91 R  Uric Acid 3.8 5.1 CM 4.8 CM 4.3 CM 5.1 CM 4.8 R, CM  Comment:            Therapeutic target for gout patients: <6.0  BUN 21 23 19 13 19 19   Creatinine, Ser 0.90 1.16 1.14 1.08 1.05 1.13  eGFR 100 74 76 82    BUN/Creatinine Ratio 23 High  20 17 12 18 17   Sodium 140 140 140 143 141 140  Potassium 4.3 4.4 4.6 4.5 4.4 4.3  Chloride 106 101 103 105 104 106  Calcium 9.1 9.7 9.6 9.4 9.4 9.4  Phosphorus 3.3 3.5 3.2 3.2 3.4 3.7  Total Protein 6.4 7.1 7.2 6.4 6.8 6.9  Albumin 4.4 4.6 4.9 4.7 4.7 4.5  Globulin, Total 2.0 2.5 2.3 1.7 2.1 2.4  Bilirubin Total 0.5 0.5 0.6 0.5 0.6 0.5  Alkaline Phosphatase 40 Low  50 R 43 Low  R 42 Low  R 40 Low  R, CM 43 R  LDH 184 198 197 197 193 195  AST 19 21 19 16 15 21   ALT 6 8 7 6 6 6   GGT 7 19 11 10 11 12   Iron 103 117 114 78 91 100  Cholesterol, Total 159 188 191 187 184  169  Triglycerides 66 47 74 72 50 52  HDL 51 51 50 49 47 49  VLDL Cholesterol Cal 13 9 14 13 10 10   LDL Chol Calc (NIH) 95 128 High  127 High  125 High  127 High  110 High   Chol/HDL Ratio 3.1 3.7 CM 3.8 CM 3.8 CM 3.9 CM 3.4 CM  Comment:                                   T. Chol/HDL Ratio  Men  Women                               1/2 Avg.Risk  3.4    3.3                                   Avg.Risk  5.0    4.4                                2X Avg.Risk  9.6    7.1                                3X Avg.Risk 23.4   11.0  Estimated CHD Risk  < 0.5 0.6 CM 0.7 CM 0.7 CM 0.7 CM 0.5 CM  Comment: The CHD Risk is based on the T. Chol/HDL ratio. Other factors affect CHD Risk such as hypertension, smoking, diabetes, severe obesity, and family history of premature CHD.  TSH 1.450 1.290 1.350 1.250 0.800 0.848  T4, Total 5.2 6.3 6.0 5.7 5.2 5.5  T3 Uptake Ratio 28 28 28 28 26 28   Free Thyroxine Index 1.5 1.8 1.7 1.6 1.4 1.5  Prostate Specific Ag, Serum 1.8 2.2 CM 1.3 CM 1.5 CM 1.5 CM 1.0 CM  Comment: Roche ECLIA methodology. According to the American Urological Association, Serum PSA should decrease and remain at undetectable levels after radical prostatectomy. The AUA defines biochemical recurrence as an initial PSA value 0.2 ng/mL or greater followed by a subsequent confirmatory PSA value 0.2 ng/mL or greater. Values obtained with different assay methods or kits cannot be used interchangeably. Results cannot be interpreted as absolute evidence of the presence or absence of malignant disease.  WBC 6.1 4.3 6.4 5.3 4.2 3.6  RBC 5.09 5.14 5.39 5.41 5.04 5.18  Hemoglobin 14.8 15.5 16.4 15.7 15.3 15.3  Hematocrit 45.9 46.5 46.8 48.1 44.3 46.1  MCV 90 91 87 89 88 89  MCH 29.1 30.2 30.4 29.0 30.4 29.5  MCHC 32.2 33.3 35.0 32.6 34.5 33.2  RDW 13.6 11.7 12.5 12.6 12.3 12.4  Platelets 162 260 194 190 186 180  Neutrophils 68 55 65 68 53 56  Lymphs 21 30 23  20  32 30  Monocytes 9 12 9 10 12 11   Eos 2 2 2 2 2 2   Basos 0 1 1 0 1 1  Neutrophils Absolute 4.2 2.4 4.2 3.5 2.2 2.0  Lymphocytes Absolute 1.3 1.3 1.5 1.1 1.4 1.1  Monocytes Absolute 0.5 0.5 0.6 0.5 0.5 0.4  EOS (ABSOLUTE) 0.1 0.1 0.2 0.1 0.1 0.1  Basophils Absolute 0.0 0.0 0.0 0.0 0.0 0.0  Immature Granulocytes 0 0 0 0 0 0  Immature Grans (Abs) 0.0 0.0 0.0 0.0 0.0 0.0              EKG Sinus bradycardia 58 bpm.  Incomplete right bundle branch block.  ____________________________________________    ____________________________________________   INITIAL IMPRESSION / ASSESSMENT AND PLAN As part of my medical decision making, I reviewed the following data within the electronic MEDICAL RECORD NUMBER  No acute findings on physical exam, EKG, or labs.  Patient given prescription for Viagra.            ____________________________________________   FINAL CLINICAL IMPRESSION  Well exam   ED Discharge Orders     None        Note:  This document was prepared using Dragon voice recognition software and may include unintentional dictation errors.

## 2024-04-02 ENCOUNTER — Ambulatory Visit: Payer: Self-pay

## 2024-04-02 DIAGNOSIS — Z23 Encounter for immunization: Secondary | ICD-10-CM
